# Patient Record
Sex: Female | Born: 1945
Health system: Southern US, Community
[De-identification: ages and names within clinical notes are randomized; demographics above are authoritative.]

## PROBLEM LIST (undated history)

## (undated) DIAGNOSIS — E785 Hyperlipidemia, unspecified: Secondary | ICD-10-CM

## (undated) DIAGNOSIS — C801 Malignant (primary) neoplasm, unspecified: Secondary | ICD-10-CM

## (undated) DIAGNOSIS — I10 Essential (primary) hypertension: Secondary | ICD-10-CM

## (undated) HISTORY — PX: ANTERIOR CRUCIATE LIGAMENT REPAIR: SHX115

## (undated) HISTORY — DX: Hyperlipidemia, unspecified: E78.5

## (undated) HISTORY — PX: ABDOMINAL HYSTERECTOMY: SHX81

## (undated) HISTORY — PX: MELANOMA EXCISION: SHX5266

## (undated) HISTORY — PX: OOPHORECTOMY: SHX86

## (undated) HISTORY — PX: BREAST CYST ASPIRATION: SHX578

## (undated) HISTORY — PX: SQUAMOUS CELL CARCINOMA EXCISION: SHX2433

## (undated) HISTORY — PX: BREAST BIOPSY: SHX20

## (undated) HISTORY — DX: Essential (primary) hypertension: I10

## (undated) HISTORY — PX: ECTOPIC PREGNANCY SURGERY: SHX613

## (undated) HISTORY — PX: KNEE CARTILAGE SURGERY: SHX688

---

## 2003-04-06 HISTORY — PX: EXCISION OF ATRIAL MYXOMA: SHX5821

## 2005-07-20 DIAGNOSIS — D151 Benign neoplasm of heart: Secondary | ICD-10-CM | POA: Insufficient documentation

## 2005-07-20 DIAGNOSIS — Z86018 Personal history of other benign neoplasm: Secondary | ICD-10-CM | POA: Insufficient documentation

## 2020-01-02 ENCOUNTER — Ambulatory Visit (INDEPENDENT_AMBULATORY_CARE_PROVIDER_SITE_OTHER): Payer: Medicare Other | Admitting: Internal Medicine

## 2020-01-02 ENCOUNTER — Other Ambulatory Visit: Payer: Self-pay

## 2020-01-02 ENCOUNTER — Encounter: Payer: Self-pay | Admitting: Internal Medicine

## 2020-01-02 VITALS — BP 132/74 | HR 93 | Ht 62.0 in | Wt 165.0 lb

## 2020-01-02 DIAGNOSIS — I10 Essential (primary) hypertension: Secondary | ICD-10-CM | POA: Diagnosis not present

## 2020-01-02 DIAGNOSIS — Z1231 Encounter for screening mammogram for malignant neoplasm of breast: Secondary | ICD-10-CM | POA: Diagnosis not present

## 2020-01-02 DIAGNOSIS — E782 Mixed hyperlipidemia: Secondary | ICD-10-CM | POA: Diagnosis not present

## 2020-01-02 DIAGNOSIS — Z85820 Personal history of malignant melanoma of skin: Secondary | ICD-10-CM | POA: Diagnosis not present

## 2020-01-02 NOTE — Patient Instructions (Signed)
Dermatology evaluation - [facility]

## 2020-01-02 NOTE — Progress Notes (Signed)
Date:  01/02/2020   Name:  Samantha Hodge   DOB:  06/13/1945   MRN:  [id number]   Chief Complaint: Establish Care (New patient. Her husband became a new patient here yesterday as well. New to the area- coming from [place]. )  Hypertension This is a chronic problem. The problem is controlled. Pertinent negatives include no chest pain, headaches, palpitations or shortness of breath. Past treatments include ACE inhibitors and beta blockers. There are no compliance problems.  There is no history of kidney disease, CAD/MI or CVA.  Hyperlipidemia This is a chronic problem. The problem is controlled. Pertinent negatives include no chest pain or shortness of breath. Current antihyperlipidemic treatment includes statins. The current treatment provides significant improvement of lipids.    Review of Systems  Constitutional: Negative for chills, fatigue, fever and unexpected weight change.  Respiratory: Negative for cough, chest tightness, shortness of breath and wheezing.   Cardiovascular: Negative for chest pain, palpitations and leg swelling.  Gastrointestinal: Negative for abdominal pain, constipation and diarrhea.  Musculoskeletal: Negative for arthralgias, gait problem and joint swelling.  Skin: Negative for rash.  Neurological: Negative for dizziness, light-headedness and headaches.  Psychiatric/Behavioral: Negative for dysphoric mood and sleep disturbance. The patient is not nervous/anxious.     There are no problems to display for this patient.   Allergies  Allergen Reactions  . Penicillins Hives    Past Surgical History:  Procedure Laterality Date  . ABDOMINAL HYSTERECTOMY    . ANTERIOR CRUCIATE LIGAMENT REPAIR     Right KNEE  . arterial myxomma    . ECTOPIC PREGNANCY SURGERY    . KNEE CARTILAGE SURGERY     meniscus removed - left knee  . MELANOMA EXCISION     Left Arm    Social History   Tobacco Use  . Smoking status: Former Smoker    Packs/day: 0.08    Years:  20.00    Pack years: 1.60    Types: Cigarettes    Quit date: 2007    Years since quitting: 14.7  . Smokeless tobacco: Never Used  Vaping Use  . Vaping Use: Never used  Substance Use Topics  . Alcohol use: Yes    Alcohol/week: 3.0 standard drinks    Types: 3 Glasses of wine per week  . Drug use: Never     Medication list has been reviewed and updated.  Current Meds  Medication Sig  . aspirin EC 81 MG tablet Take 81 mg by mouth daily. Swallow whole.  Marland Kitchen atorvastatin (LIPITOR) 40 MG tablet Take 40 mg by mouth daily.  . calcium-vitamin D (OSCAL WITH D) 500-200 MG-UNIT tablet Take 1 tablet by mouth.  Marland Kitchen lisinopril (ZESTRIL) 40 MG tablet Take 20 mg by mouth daily.  . metoprolol succinate (TOPROL-XL) 50 MG 24 hr tablet Take 25 mg by mouth daily. Take with or immediately following a meal.    PHQ 2/9 Scores 01/02/2020  PHQ - 2 Score 0  PHQ- 9 Score 0    GAD 7 : Generalized Anxiety Score 01/02/2020  Nervous, Anxious, on Edge 0  Control/stop worrying 0  Worry too much - different things 0  Trouble relaxing 0  Restless 0  Easily annoyed or irritable 0  Afraid - awful might happen 0  Total GAD 7 Score 0  Anxiety Difficulty Not difficult at all    BP Readings from Last 3 Encounters:  01/02/20 132/74    Physical Exam Vitals and nursing note reviewed.  Constitutional:  General: She is not in acute distress.    Appearance: She is well-developed.  HENT:     Head: Normocephalic and atraumatic.  Cardiovascular:     Rate and Rhythm: Normal rate and regular rhythm.     Pulses: Normal pulses.     Heart sounds: No murmur heard.   Pulmonary:     Effort: Pulmonary effort is normal. No respiratory distress.  Musculoskeletal:     Cervical back: Normal range of motion.     Right lower leg: No edema.     Left lower leg: No edema.  Skin:    General: Skin is warm and dry.     Capillary Refill: Capillary refill takes less than 2 seconds.     Findings: No rash.  Neurological:      General: No focal deficit present.     Mental Status: She is alert and oriented to person, place, and time.  Psychiatric:        Mood and Affect: Mood normal.        Behavior: Behavior normal.     Wt Readings from Last 3 Encounters:  01/02/20 165 lb (74.8 kg)    BP 132/74 (BP Location: Right Arm, Patient Position: Sitting, Cuff Size: Normal)   Pulse 93   Ht 5\' 2"  (1.575 m)   Wt 165 lb (74.8 kg)   SpO2 97%   BMI 30.18 kg/m   Assessment and Plan: 1. Essential hypertension Clinically stable exam with well controlled BP. Tolerating medications without side effects at this time. Pt to continue current regimen and low sodium diet; benefits of regular exercise as able discussed. - CBC with Differential/Platelet - TSH  2. Mixed hyperlipidemia On high dose statin therapy for primary prevention No side effects noted - Comprehensive metabolic panel - Lipid panel  3. Hx of melanoma of skin Needs to establish care with a dermatologist - recommend [facility]  4. Encounter for screening mammogram for breast cancer Pt will sign for previous films then schedule annual mammogram - MM 3D SCREEN BREAST BILATERAL; Future   Partially dictated using Editor, commissioning. Any errors are unintentional.  Halina Maidens, MD [facility]  01/02/2020

## 2020-01-03 LAB — CBC WITH DIFFERENTIAL/PLATELET
Basophils Absolute: 0.1 10*3/uL (ref 0.0–0.2)
Basos: 1 %
EOS (ABSOLUTE): 0.1 10*3/uL (ref 0.0–0.4)
Eos: 1 %
Hematocrit: 42 % (ref 34.0–46.6)
Hemoglobin: 14.2 g/dL (ref 11.1–15.9)
Immature Grans (Abs): 0 10*3/uL (ref 0.0–0.1)
Immature Granulocytes: 0 %
Lymphocytes Absolute: 2.2 10*3/uL (ref 0.7–3.1)
Lymphs: 33 %
MCH: 29.5 pg (ref 26.6–33.0)
MCHC: 33.8 g/dL (ref 31.5–35.7)
MCV: 87 fL (ref 79–97)
Monocytes Absolute: 0.8 10*3/uL (ref 0.1–0.9)
Monocytes: 12 %
Neutrophils Absolute: 3.5 10*3/uL (ref 1.4–7.0)
Neutrophils: 53 %
Platelets: 278 10*3/uL (ref 150–450)
RBC: 4.82 x10E6/uL (ref 3.77–5.28)
RDW: 12.2 % (ref 11.7–15.4)
WBC: 6.6 10*3/uL (ref 3.4–10.8)

## 2020-01-03 LAB — COMPREHENSIVE METABOLIC PANEL
ALT: 19 IU/L (ref 0–32)
AST: 22 IU/L (ref 0–40)
Albumin/Globulin Ratio: 2.2 (ref 1.2–2.2)
Albumin: 4.7 g/dL (ref 3.7–4.7)
Alkaline Phosphatase: 58 IU/L (ref 44–121)
BUN/Creatinine Ratio: 25 (ref 12–28)
BUN: 16 mg/dL (ref 8–27)
Bilirubin Total: 0.5 mg/dL (ref 0.0–1.2)
CO2: 24 mmol/L (ref 20–29)
Calcium: 10.2 mg/dL (ref 8.7–10.3)
Chloride: 103 mmol/L (ref 96–106)
Creatinine, Ser: 0.65 mg/dL (ref 0.57–1.00)
GFR calc Af Amer: 100 mL/min/{1.73_m2} (ref >59)
GFR calc non Af Amer: 87 mL/min/{1.73_m2} (ref >59)
Globulin, Total: 2.1 g/dL (ref 1.5–4.5)
Glucose: 80 mg/dL (ref 65–99)
Potassium: 4.6 mmol/L (ref 3.5–5.2)
Sodium: 142 mmol/L (ref 134–144)
Total Protein: 6.8 g/dL (ref 6.0–8.5)

## 2020-01-03 LAB — LIPID PANEL
Chol/HDL Ratio: 3 ratio (ref 0.0–4.4)
Cholesterol, Total: 185 mg/dL (ref 100–199)
HDL: 61 mg/dL (ref >39)
LDL Chol Calc (NIH): 100 mg/dL — ABNORMAL HIGH (ref 0–99)
Triglycerides: 139 mg/dL (ref 0–149)
VLDL Cholesterol Cal: 24 mg/dL (ref 5–40)

## 2020-01-03 LAB — TSH: TSH: 2.78 u[IU]/mL (ref 0.450–4.500)

## 2020-01-16 ENCOUNTER — Telehealth: Payer: Self-pay | Admitting: Internal Medicine

## 2020-01-16 NOTE — Telephone Encounter (Signed)
Left message for patient to call back and schedule Medicare Annual Wellness Visit (AWV) either virtually/audio only or in office. Whichever the patients preference is.  Last AWV 12/2018; please schedule at anytime with [facility] Health Advisor.  This should be a 40 minute visit.  AWV-S DUE 12/05/2019 PER [place]

## 2020-01-28 ENCOUNTER — Ambulatory Visit (INDEPENDENT_AMBULATORY_CARE_PROVIDER_SITE_OTHER): Payer: Medicare Other

## 2020-01-28 ENCOUNTER — Other Ambulatory Visit: Payer: Self-pay

## 2020-01-28 DIAGNOSIS — Z23 Encounter for immunization: Secondary | ICD-10-CM

## 2020-02-02 ENCOUNTER — Encounter: Payer: Self-pay | Admitting: Gynecology

## 2020-02-02 ENCOUNTER — Other Ambulatory Visit: Payer: Self-pay

## 2020-02-02 ENCOUNTER — Ambulatory Visit
Admission: EM | Admit: 2020-02-02 | Discharge: 2020-02-02 | Disposition: A | Discharge status: Home / Self Care | Payer: Medicare Other | Attending: Physician Assistant | Admitting: Physician Assistant

## 2020-02-02 DIAGNOSIS — R059 Cough, unspecified: Secondary | ICD-10-CM | POA: Insufficient documentation

## 2020-02-02 DIAGNOSIS — J029 Acute pharyngitis, unspecified: Secondary | ICD-10-CM | POA: Diagnosis present

## 2020-02-02 DIAGNOSIS — J069 Acute upper respiratory infection, unspecified: Secondary | ICD-10-CM | POA: Insufficient documentation

## 2020-02-02 DIAGNOSIS — R0982 Postnasal drip: Secondary | ICD-10-CM | POA: Insufficient documentation

## 2020-02-02 LAB — GROUP A STREP BY PCR: Group A Strep by PCR: NOT DETECTED

## 2020-02-02 MED ORDER — IPRATROPIUM BROMIDE 0.06 % NA SOLN: 2.0000 | Freq: Four times a day (QID) | NASAL | 12 refills | Status: DC

## 2020-02-02 MED ORDER — PSEUDOEPH-BROMPHEN-DM 30-2-10 MG/5ML PO SYRP: 10.0000 mL | ORAL_SOLUTION | Freq: Four times a day (QID) | ORAL | 0 refills | Status: AC | PRN

## 2020-02-02 NOTE — ED Triage Notes (Signed)
Patient c/o sinus headache, sinus drainage x 4-5 days.

## 2020-02-02 NOTE — Discharge Instructions (Signed)
URI/COLD SYMPTOMS: Your exam today is consistent with a viral illness. Antibiotics are not indicated at this time. Use medications as directed, including cough syrup, nasal saline, and decongestants. Your symptoms should improve over the next few days and resolve within 7-10 days. Increase rest and fluids. F/u if symptoms worsen or predominate such as sore throat, ear pain, productive cough, shortness of breath, or if you develop high fevers or worsening fatigue over the next several days.    

## 2020-02-02 NOTE — ED Provider Notes (Signed)
[facility]    CSN: [id number] Arrival date & time: 02/02/20  0913      History   Chief Complaint Chief Complaint  Patient presents with  . Sinusitis    HPI Samantha Hodge is a 74 y.o. female presenting for sore throat and postnasal drainage.  She also complains of frontal headache and nasal congestion.  Denies sinus tenderness or ear pain.  She states that her biggest concern is her sore throat.  She says that her son and granddaughter have both recently been ill.  She denies any known Covid exposure and is fully vaccinated for Covid.  She admits to personal history of Covid in December 2020 and is not concerned for Covid at this time.  She has been taking over-the-counter decongestants and says that they do help when she takes them.  Says that she felt worse yesterday than today.  She denies any fever, fatigue, body aches, chest pain, breathing difficulty, nausea, vomiting, diarrhea or changes in smell or taste.  She is otherwise healthy without any history of cardiopulmonary disease.  She has no other complaints or concerns today.  HPI  Past Medical History:  Diagnosis Date  . Hyperlipidemia   . Hypertension     Patient Active Problem List   Diagnosis Date Noted  . Essential hypertension 01/02/2020  . Mixed hyperlipidemia 01/02/2020  . Hx of melanoma of skin 01/02/2020    Past Surgical History:  Procedure Laterality Date  . ABDOMINAL HYSTERECTOMY    . ANTERIOR CRUCIATE LIGAMENT REPAIR     Right KNEE  . ECTOPIC PREGNANCY SURGERY    . EXCISION OF ATRIAL MYXOMA  2005  . KNEE CARTILAGE SURGERY     meniscus removed - left knee  . MELANOMA EXCISION     Left Arm    OB History   No obstetric history on file.      Home Medications    Prior to Admission medications   Medication Sig Start Date End Date Taking? Authorizing Provider  aspirin EC 81 MG tablet Take 81 mg by mouth daily. Swallow whole.   Yes [provider]  calcium-vitamin D (OSCAL WITH  D) 500-200 MG-UNIT tablet Take 1 tablet by mouth.   Yes [provider]  lisinopril (ZESTRIL) 40 MG tablet Take 20 mg by mouth daily.   Yes [provider]  metoprolol succinate (TOPROL-XL) 50 MG 24 hr tablet Take 25 mg by mouth daily. Take with or immediately following a meal.   Yes [provider]  rosuvastatin (CRESTOR) 40 MG tablet Take 40 mg by mouth daily.   Yes [provider]  brompheniramine-pseudoephedrine-DM 30-2-10 MG/5ML syrup Take 10 mLs by mouth 4 (four) times daily as needed for up to 7 days. 02/02/20 02/09/20  Laurene Footman B, PA-C  ipratropium (ATROVENT) 0.06 % nasal spray Place 2 sprays into both nostrils 4 (four) times daily. 02/02/20   Danton Clap, PA-C    Family History Family History  Problem Relation Age of Onset  . Breast cancer Mother   . Anuerysm Daughter   . Heart disease Maternal Grandfather     Social History Social History   Tobacco Use  . Smoking status: Former Smoker    Packs/day: 0.08    Years: 20.00    Pack years: 1.60    Types: Cigarettes    Quit date: 2007    Years since quitting: 14.8  . Smokeless tobacco: Never Used  Vaping Use  . Vaping Use: Never used  Substance  Use Topics  . Alcohol use: Yes    Alcohol/week: 3.0 standard drinks    Types: 3 Glasses of wine per week  . Drug use: Never     Allergies   Penicillins   Review of Systems Review of Systems  Constitutional: Negative for chills, diaphoresis, fatigue and fever.  HENT: Positive for congestion, postnasal drip, rhinorrhea and sore throat. Negative for ear pain, sinus pressure and sinus pain.   Respiratory: Positive for cough. Negative for shortness of breath.   Cardiovascular: Negative for chest pain.  Gastrointestinal: Negative for abdominal pain, nausea and vomiting.  Musculoskeletal: Negative for arthralgias and myalgias.  Skin: Negative for rash.  Neurological: Negative for weakness and headaches.  Hematological: Negative for  adenopathy.     Physical Exam Triage Vital Signs ED Triage Vitals  Enc Vitals Group     BP 02/02/20 0923 133/90     Pulse Rate 02/02/20 0923 98     Resp 02/02/20 0923 16     Temp 02/02/20 0923 98.4 F (36.9 C)     Temp Source 02/02/20 0923 Oral     SpO2 02/02/20 0923 98 %     Weight 02/02/20 0925 160 lb (72.6 kg)     Height 02/02/20 0925 5\' 2"  (1.575 m)     Head Circumference --      Peak Flow --      Pain Score 02/02/20 0924 8     Pain Loc --      Pain Edu? --      Excl. in [place]? --    No data found.  Updated Vital Signs BP 133/90 (BP Location: Left Arm)   Pulse 98   Temp 98.4 F (36.9 C) (Oral)   Resp 16   Ht 5\' 2"  (1.575 m)   Wt 160 lb (72.6 kg)   SpO2 98%   BMI 29.26 kg/m      Physical Exam Vitals and nursing note reviewed.  Constitutional:      General: She is not in acute distress.    Appearance: Normal appearance. She is not ill-appearing or toxic-appearing.  HENT:     Head: Normocephalic and atraumatic.     Right Ear: Tympanic membrane, ear canal and external ear normal.     Left Ear: Tympanic membrane, ear canal and external ear normal.     Nose: Congestion and rhinorrhea present.     Mouth/Throat:     Mouth: Mucous membranes are moist.     Pharynx: Oropharynx is clear. Posterior oropharyngeal erythema (with clear PND-copius amount) present.  Eyes:     General: No scleral icterus.       Right eye: No discharge.        Left eye: No discharge.     Conjunctiva/sclera: Conjunctivae normal.  Cardiovascular:     Rate and Rhythm: Normal rate and regular rhythm.     Heart sounds: Normal heart sounds.  Pulmonary:     Effort: Pulmonary effort is normal. No respiratory distress.     Breath sounds: Normal breath sounds. No wheezing, rhonchi or rales.  Musculoskeletal:     Cervical back: Neck supple.  Skin:    General: Skin is dry.  Neurological:     General: No focal deficit present.     Mental Status: She is alert. Mental status is at baseline.      Motor: No weakness.     Gait: Gait normal.  Psychiatric:        Mood and Affect: Mood normal.  Behavior: Behavior normal.        Thought Content: Thought content normal.      UC Treatments / Results  Labs (all labs ordered are listed, but only abnormal results are displayed) Labs Reviewed  GROUP A STREP BY PCR    EKG   Radiology No results found.  Procedures Procedures (including critical care time)  Medications Ordered in UC Medications - No data to display  Initial Impression / Assessment and Plan / UC Course  I have reviewed the triage vital signs and the nursing notes.  Pertinent labs & imaging results that were available during my care of the patient were reviewed by me and considered in my medical decision making (see chart for details).   Patient symptoms consistent with viral upper respiratory infection.  She declines any Covid testing at this time.  Rapid strep test performed today and negative.  Advised patient that she has a upper respiratory infection and it should resolve within 7 to 10 days.  I sent Bromfed and Atrovent nasal spray to pharmacy.  Advised increasing rest and fluid intake.  Advised to follow-up as needed if not better over the next week or she develops any new or worsening symptoms including fever, fatigue, chest pain, worsening cough or breathing difficulty.  Patient agreeable.  Final Clinical Impressions(s) / UC Diagnoses   Final diagnoses:  Acute upper respiratory infection  Acute sore throat  Post-nasal drainage  Cough     Discharge Instructions     URI/COLD SYMPTOMS: Your exam today is consistent with a viral illness. Antibiotics are not indicated at this time. Use medications as directed, including cough syrup, nasal saline, and decongestants. Your symptoms should improve over the next few days and resolve within 7-10 days. Increase rest and fluids. F/u if symptoms worsen or predominate such as sore throat, ear pain, productive  cough, shortness of breath, or if you develop high fevers or worsening fatigue over the next several days.      ED Prescriptions    Medication Sig Dispense Auth. Provider   ipratropium (ATROVENT) 0.06 % nasal spray Place 2 sprays into both nostrils 4 (four) times daily. 15 mL Laurene Footman B, PA-C   brompheniramine-pseudoephedrine-DM 30-2-10 MG/5ML syrup Take 10 mLs by mouth 4 (four) times daily as needed for up to 7 days. 120 mL Danton Clap, PA-C     PDMP not reviewed this encounter.   Danton Clap, PA-C 02/02/20 1054

## 2020-02-12 ENCOUNTER — Ambulatory Visit
Admission: RE | Admit: 2020-02-12 | Discharge: 2020-02-12 | Disposition: A | Discharge status: Home / Self Care | Payer: Medicare Other | Source: Ambulatory Visit | Attending: Internal Medicine | Admitting: Internal Medicine

## 2020-02-12 ENCOUNTER — Other Ambulatory Visit: Payer: Self-pay

## 2020-02-12 DIAGNOSIS — Z1231 Encounter for screening mammogram for malignant neoplasm of breast: Secondary | ICD-10-CM | POA: Diagnosis present

## 2020-02-12 HISTORY — DX: Malignant (primary) neoplasm, unspecified: C80.1

## 2020-03-24 ENCOUNTER — Other Ambulatory Visit: Payer: Self-pay | Admitting: Internal Medicine

## 2020-03-24 NOTE — Telephone Encounter (Signed)
Requested medication (s) are due for refill today - unsure  Requested medication (s) are on the active medication list -yes  Future visit scheduled -yes  Last refill: 01/17/20  Notes to clinic: Request RF- historical medications/provider  Requested Prescriptions  Pending Prescriptions Disp Refills   metoprolol succinate (TOPROL-XL) 50 MG 24 hr tablet [Pharmacy Med Name: METOPROLOL SUCC ER 50 MG TAB] 90 tablet 1    Sig: TAKE 1 TABLET BY MOUTH EVERY DAY      Cardiovascular:  Beta Blockers Failed - 03/24/2020  9:44 AM      Failed - Last BP in normal range    BP Readings from Last 1 Encounters:  02/02/20 133/90          Passed - Last Heart Rate in normal range    Pulse Readings from Last 1 Encounters:  02/02/20 98          Passed - Valid encounter within last 6 months    Recent Outpatient Visits           2 months ago Essential hypertension   [facility] Glean Hess, MD       Future Appointments             In 3 months [facility] Melia Jesse Sans, MD [facility]               lisinopril (ZESTRIL) 40 MG tablet [Pharmacy Med Name: LISINOPRIL 40 MG TABLET] 45 tablet 1    Sig: TAKE 1/2 TABLET BY MOUTH EVERY MORNING      Cardiovascular:  ACE Inhibitors Failed - 03/24/2020  9:44 AM      Failed - Last BP in normal range    BP Readings from Last 1 Encounters:  02/02/20 133/90          Passed - Cr in normal range and within 180 days    Creatinine, Ser  Date Value Ref Range Status  01/02/2020 0.65 0.57 - 1.00 mg/dL Final          Passed - K in normal range and within 180 days    Potassium  Date Value Ref Range Status  01/02/2020 4.6 3.5 - 5.2 mmol/L Final          Passed - Patient is not pregnant      Passed - Valid encounter within last 6 months    Recent Outpatient Visits           2 months ago Essential hypertension   [place], Laura H, MD       Future Appointments             In 3 months Glean Hess, MD [facility], [facility]                 Requested Prescriptions  Pending Prescriptions Disp Refills   metoprolol succinate (TOPROL-XL) 50 MG 24 hr tablet [Pharmacy Med Name: METOPROLOL SUCC ER 50 MG TAB] 90 tablet 1    Sig: TAKE 1 TABLET BY MOUTH EVERY DAY      Cardiovascular:  Beta Blockers Failed - 03/24/2020  9:44 AM      Failed - Last BP in normal range    BP Readings from Last 1 Encounters:  02/02/20 133/90          Passed - Last Heart Rate in normal range    Pulse Readings from Last 1 Encounters:  02/02/20 98  Passed - Valid encounter within last 6 months    Recent Outpatient Visits           2 months ago Essential hypertension   [facility] Glean Hess, MD       Future Appointments             In 3 months [facility] Melia Jesse Sans, MD [facility]               lisinopril (ZESTRIL) 40 MG tablet [Pharmacy Med Name: LISINOPRIL 40 MG TABLET] 45 tablet 1    Sig: TAKE 1/2 TABLET BY MOUTH EVERY MORNING      Cardiovascular:  ACE Inhibitors Failed - 03/24/2020  9:44 AM      Failed - Last BP in normal range    BP Readings from Last 1 Encounters:  02/02/20 133/90          Passed - Cr in normal range and within 180 days    Creatinine, Ser  Date Value Ref Range Status  01/02/2020 0.65 0.57 - 1.00 mg/dL Final          Passed - K in normal range and within 180 days    Potassium  Date Value Ref Range Status  01/02/2020 4.6 3.5 - 5.2 mmol/L Final          Passed - Patient is not pregnant      Passed - Valid encounter within last 6 months    Recent Outpatient Visits           2 months ago Essential hypertension   [facility] Glean Hess, MD       Future Appointments             In 3 months [facility] Melia Jesse Sans, MD [facility], [facility]

## 2020-05-13 DIAGNOSIS — L821 Other seborrheic keratosis: Secondary | ICD-10-CM | POA: Diagnosis not present

## 2020-05-13 DIAGNOSIS — L578 Other skin changes due to chronic exposure to nonionizing radiation: Secondary | ICD-10-CM | POA: Diagnosis not present

## 2020-05-13 DIAGNOSIS — Z859 Personal history of malignant neoplasm, unspecified: Secondary | ICD-10-CM | POA: Diagnosis not present

## 2020-05-13 DIAGNOSIS — Z85820 Personal history of malignant melanoma of skin: Secondary | ICD-10-CM | POA: Diagnosis not present

## 2020-05-13 DIAGNOSIS — L570 Actinic keratosis: Secondary | ICD-10-CM | POA: Diagnosis not present

## 2020-05-13 DIAGNOSIS — D485 Neoplasm of uncertain behavior of skin: Secondary | ICD-10-CM | POA: Diagnosis not present

## 2020-05-16 ENCOUNTER — Ambulatory Visit
Admission: EM | Admit: 2020-05-16 | Discharge: 2020-05-16 | Disposition: A | Discharge status: Other Acute Inpatient Hospital | Payer: Medicare Other | Attending: Family Medicine | Admitting: Family Medicine

## 2020-05-16 ENCOUNTER — Ambulatory Visit: Payer: Self-pay | Admitting: *Deleted

## 2020-05-16 ENCOUNTER — Other Ambulatory Visit: Payer: Self-pay

## 2020-05-16 DIAGNOSIS — E7800 Pure hypercholesterolemia, unspecified: Secondary | ICD-10-CM | POA: Diagnosis not present

## 2020-05-16 DIAGNOSIS — E785 Hyperlipidemia, unspecified: Secondary | ICD-10-CM | POA: Diagnosis not present

## 2020-05-16 DIAGNOSIS — D329 Benign neoplasm of meninges, unspecified: Secondary | ICD-10-CM | POA: Diagnosis not present

## 2020-05-16 DIAGNOSIS — R42 Dizziness and giddiness: Secondary | ICD-10-CM | POA: Diagnosis not present

## 2020-05-16 DIAGNOSIS — I10 Essential (primary) hypertension: Secondary | ICD-10-CM | POA: Diagnosis not present

## 2020-05-16 DIAGNOSIS — H9319 Tinnitus, unspecified ear: Secondary | ICD-10-CM | POA: Diagnosis not present

## 2020-05-16 DIAGNOSIS — Z87891 Personal history of nicotine dependence: Secondary | ICD-10-CM | POA: Diagnosis not present

## 2020-05-16 DIAGNOSIS — Z880 Allergy status to penicillin: Secondary | ICD-10-CM | POA: Diagnosis not present

## 2020-05-16 DIAGNOSIS — I6782 Cerebral ischemia: Secondary | ICD-10-CM | POA: Diagnosis not present

## 2020-05-16 LAB — BASIC METABOLIC PANEL
BUN: 12 (ref 4–21)
CO2: 25 — AB (ref 13–22)
Chloride: 107 (ref 99–108)
Creatinine: 0.6 (ref 0.5–1.1)
Glucose: 102
Potassium: 4.5 (ref 3.4–5.3)
Sodium: 139 (ref 137–147)

## 2020-05-16 LAB — HEPATIC FUNCTION PANEL
ALT: 40 — AB (ref 7–35)
AST: 37 — AB (ref 13–35)
Alkaline Phosphatase: 59 (ref 25–125)
Bilirubin, Total: 0.7

## 2020-05-16 NOTE — Telephone Encounter (Signed)
  Patient's husband called and reported patient was dizzy and almost passed out after leaning forward this am. Patient reports she woke up around 6am and was dizzy but able to walk to the bathroom and able to take a shower. Reports off balance, or unsteady  walking, now sitting in chair, and B/P 182/117 HR 76. Denies room spinning , nausea or vomiting. Denies weakness on either side, visual disturbances or facial numbness. No slurred or garbled speech. Husband able to recheck B/P for 173/104 HR 71 while sitting. Patient reports she did take her B/P meds this am. Encouraged patient and husband to get patient assessed at ED. Care advise given. . Patient and husband verbalized understanding of care advise and to go to ED and if symptoms worsen call 911.    Reason for Disposition . [1] Dizziness (vertigo) present now AND [2] one or more STROKE RISK FACTORS (i.e., hypertension, diabetes, prior stroke/TIA, heart attack)  (Exception: prior physician evaluation for this AND no different/worse than usual)  Answer Assessment - Initial Assessment Questions 1. DESCRIPTION: "Describe your dizziness."     When leaned over became dizzy  2. VERTIGO: "Do you feel like either you or the room is spinning or tilting?"      No  3. LIGHTHEADED: "Do you feel lightheaded?" (e.g., somewhat faint, woozy, weak upon standing)     Feels different but cant explain how she is feeling  4. SEVERITY: "How bad is it?"  "Can you walk?"   - MILD: Feels unsteady but walking normally.   - MODERATE: Feels very unsteady when walking, but not falling; interferes with normal activities (e.g., school, work) .   - SEVERE: Unable to walk without falling, or requires assistance to walk without falling.     Moderate  5. ONSET:  "When did the dizziness begin?"     Around 6am in bed ceiling started spinning around 6. AGGRAVATING FACTORS: "Does anything make it worse?" (e.g., standing, change in head position)     Change in head position 7.  CAUSE: "What do you think is causing the dizziness?"     Not sure  8. RECURRENT SYMPTOM: "Have you had dizziness before?" If Yes, ask: "When was the last time?" "What happened that time?"     Yes a long time ago  9. OTHER SYMPTOMS: "Do you have any other symptoms?" (e.g., headache, weakness, numbness, vomiting, earache)     diarrhea 10. PREGNANCY: "Is there any chance you are pregnant?" "When was your last menstrual period?"       na  Protocols used: DIZZINESS - VERTIGO-A-AH

## 2020-05-20 ENCOUNTER — Encounter: Payer: Self-pay | Admitting: Internal Medicine

## 2020-05-20 ENCOUNTER — Ambulatory Visit (INDEPENDENT_AMBULATORY_CARE_PROVIDER_SITE_OTHER): Payer: Medicare HMO | Admitting: Internal Medicine

## 2020-05-20 ENCOUNTER — Other Ambulatory Visit: Payer: Self-pay

## 2020-05-20 VITALS — BP 152/88 | HR 99 | Temp 97.6°F | Ht 62.0 in | Wt 164.0 lb

## 2020-05-20 DIAGNOSIS — D329 Benign neoplasm of meninges, unspecified: Secondary | ICD-10-CM | POA: Insufficient documentation

## 2020-05-20 DIAGNOSIS — N3001 Acute cystitis with hematuria: Secondary | ICD-10-CM

## 2020-05-20 DIAGNOSIS — R42 Dizziness and giddiness: Secondary | ICD-10-CM

## 2020-05-20 LAB — POC URINALYSIS WITH MICROSCOPIC (NON AUTO)MANUAL RESULT
Bilirubin, UA: NEGATIVE
Crystals: 0
Epithelial cells, urine per micros: 0
Glucose, UA: NEGATIVE
Ketones, UA: NEGATIVE
Mucus, UA: 0
Nitrite, UA: NEGATIVE
Protein, UA: POSITIVE — AB
RBC: 100 M/uL — AB (ref 4.04–5.48)
Spec Grav, UA: >=1.03 — AB (ref 1.010–1.025)
Urobilinogen, UA: 0.2 E.U./dL
WBC Casts, UA: 100
pH, UA: 5 (ref 5.0–8.0)

## 2020-05-20 MED ORDER — SULFAMETHOXAZOLE-TRIMETHOPRIM 800-160 MG PO TABS: 1.0000 | ORAL_TABLET | Freq: Two times a day (BID) | ORAL | 0 refills | Status: AC

## 2020-05-20 NOTE — Progress Notes (Signed)
Date:  05/20/2020   Name:  Samantha Hodge   DOB:  1945/07/22   MRN:  [id number]   Chief Complaint: Urinary Tract Infection (X3 days, Strong odor, burning, blood, urgency to go but little comes out ) and Colon Cancer Screening (Wants Colonoscopy )  Urinary Tract Infection  This is a new problem. The current episode started in the past 7 days. The problem occurs every urination. The problem has been unchanged. The quality of the pain is described as burning. The pain is mild. There has been no fever. Associated symptoms include frequency, hematuria, hesitancy and urgency. Pertinent negatives include no chills or nausea. She has tried increased fluids for the symptoms. The treatment provided no relief.  Dizziness This is a new problem. The current episode started in the past 7 days. The problem has been rapidly improving. Pertinent negatives include no chest pain, chills, fatigue, fever, headaches, nausea or weakness. The symptoms are aggravated by bending. Treatments tried: Epley maneuvers. The treatment provided moderate relief.  Seen at [facility] - MRI done with following findings.  She is waiting for a call from [facility]. MRI 05/2020: IMPRESSION:  Mild small vessel ischemic changes.   Multiple remote supratentorial and infratentorial and infarcts. Cerebellar chronic infarcts are numerous in comparison to supratentorial.   Small right parafalcine meningioma measuring 0.9 cm.   Small enhancing lesion in thedens and may be degenerative however cannot exclude metastatic lesion. Please correlate with patient history.  Lab Results  Component Value Date   CREATININE 0.65 01/02/2020   BUN 16 01/02/2020   NA 142 01/02/2020   K 4.6 01/02/2020   CL 103 01/02/2020   CO2 24 01/02/2020   Lab Results  Component Value Date   CHOL 185 01/02/2020   HDL 61 01/02/2020   LDLCALC 100 (H) 01/02/2020   TRIG 139 01/02/2020   CHOLHDL 3.0 01/02/2020   Lab Results  Component Value Date   TSH 2.780  01/02/2020   No results found for: HGBA1C Lab Results  Component Value Date   WBC 6.6 01/02/2020   HGB 14.2 01/02/2020   HCT 42.0 01/02/2020   MCV 87 01/02/2020   PLT 278 01/02/2020   Lab Results  Component Value Date   ALT 19 01/02/2020   AST 22 01/02/2020   ALKPHOS 58 01/02/2020   BILITOT 0.5 01/02/2020     Review of Systems  Constitutional: Negative for chills, fatigue and fever.  Eyes: Negative for visual disturbance.  Respiratory: Negative for chest tightness and shortness of breath.   Cardiovascular: Negative for chest pain, palpitations and leg swelling.  Gastrointestinal: Negative for diarrhea and nausea.  Genitourinary: Positive for frequency, hematuria, hesitancy and urgency.  Neurological: Positive for dizziness (much improved). Negative for weakness and headaches.    Patient Active Problem List   Diagnosis Date Noted  . Meningioma ([place]) 05/20/2020  . Essential hypertension 01/02/2020  . Mixed hyperlipidemia 01/02/2020  . Hx of melanoma of skin 01/02/2020    Allergies  Allergen Reactions  . Penicillins Hives    Past Surgical History:  Procedure Laterality Date  . ABDOMINAL HYSTERECTOMY    . ANTERIOR CRUCIATE LIGAMENT REPAIR     Right KNEE  . BREAST BIOPSY Left    stereo bx/clip-neg  . BREAST CYST ASPIRATION Left    fna- neg  . ECTOPIC PREGNANCY SURGERY    . EXCISION OF ATRIAL MYXOMA  2005  . KNEE CARTILAGE SURGERY     meniscus removed - left knee  . MELANOMA EXCISION  Left Arm  . OOPHORECTOMY      Social History   Tobacco Use  . Smoking status: Former Smoker    Packs/day: 0.08    Years: 20.00    Pack years: 1.60    Types: Cigarettes    Quit date: 2007    Years since quitting: 15.1  . Smokeless tobacco: Never Used  Vaping Use  . Vaping Use: Never used  Substance Use Topics  . Alcohol use: Yes    Alcohol/week: 3.0 standard drinks    Types: 3 Glasses of wine per week  . Drug use: Never     Medication list has been reviewed  and updated.  Current Meds  Medication Sig  . aspirin EC 81 MG tablet Take 81 mg by mouth daily. Swallow whole.  . calcium-vitamin D (OSCAL WITH D) 500-200 MG-UNIT tablet Take 1 tablet by mouth.  Marland Kitchen lisinopril (ZESTRIL) 40 MG tablet TAKE 1/2 TABLET BY MOUTH EVERY MORNING  . metoprolol succinate (TOPROL-XL) 50 MG 24 hr tablet TAKE 1 TABLET BY MOUTH EVERY DAY (Patient taking differently: Half a tablet (25 MG))  . rosuvastatin (CRESTOR) 40 MG tablet Take 40 mg by mouth daily.  Marland Kitchen sulfamethoxazole-trimethoprim (BACTRIM DS) 800-160 MG tablet Take 1 tablet by mouth 2 (two) times daily for 7 days.    PHQ 2/9 Scores 05/20/2020 01/02/2020  PHQ - 2 Score 0 0  PHQ- 9 Score 0 0    GAD 7 : Generalized Anxiety Score 05/20/2020 01/02/2020  Nervous, Anxious, on Edge 1 0  Control/stop worrying 0 0  Worry too much - different things 0 0  Trouble relaxing 0 0  Restless 0 0  Easily annoyed or irritable 0 0  Afraid - awful might happen 0 0  Total GAD 7 Score 1 0  Anxiety Difficulty - Not difficult at all    BP Readings from Last 3 Encounters:  05/20/20 (!) 152/88  02/02/20 133/90  01/02/20 132/74    Physical Exam Vitals and nursing note reviewed.  Constitutional:      Appearance: Normal appearance. She is well-developed and well-nourished.  HENT:     Head: Normocephalic.  Eyes:     Extraocular Movements: Extraocular movements intact.     Pupils: Pupils are equal, round, and reactive to light.  Cardiovascular:     Rate and Rhythm: Normal rate and regular rhythm.     Pulses: Normal pulses.     Heart sounds: Normal heart sounds.  Pulmonary:     Effort: Pulmonary effort is normal. No respiratory distress.     Breath sounds: Normal breath sounds.  Abdominal:     General: Bowel sounds are normal.     Palpations: Abdomen is soft.     Tenderness: There is no abdominal tenderness. There is no CVA tenderness, guarding or rebound.  Lymphadenopathy:     Cervical: No cervical adenopathy.  Skin:     General: Skin is warm and dry.     Findings: Lesion (from skin lesion destruction by Derm) present.  Neurological:     Mental Status: She is alert.  Psychiatric:        Mood and Affect: Mood and affect and mood normal.        Behavior: Behavior normal.     Wt Readings from Last 3 Encounters:  05/20/20 164 lb (74.4 kg)  02/02/20 160 lb (72.6 kg)  01/02/20 165 lb (74.8 kg)    BP (!) 152/88   Pulse 99   Temp 97.6 F (36.4 C) (Oral)  Ht 5\' 2"  (1.575 m)   Wt 164 lb (74.4 kg)   SpO2 96%   BMI 30.00 kg/m   Assessment and Plan: 1. Acute cystitis with hematuria Continue to push fluids Can take AZO if needed - POC urinalysis w microscopic (non auto) - sulfamethoxazole-trimethoprim (BACTRIM DS) 800-160 MG tablet; Take 1 tablet by mouth 2 (two) times daily for 7 days.  Dispense: 14 tablet; Refill: 0  2. Meningioma ([place]) Benign and not likely to be causing vertigo However, the other brain lesion in the dens needs to be reviewed by Neurology - [facility] will be calling her to schedule an appointment  3. Vertigo Continue Epley exercises Much improved today - no medication is needed.   Partially dictated using Editor, commissioning. Any errors are unintentional.  Halina Maidens, MD [facility]  05/20/2020

## 2020-05-22 DIAGNOSIS — D329 Benign neoplasm of meninges, unspecified: Secondary | ICD-10-CM | POA: Diagnosis not present

## 2020-06-10 DIAGNOSIS — L570 Actinic keratosis: Secondary | ICD-10-CM | POA: Diagnosis not present

## 2020-06-30 NOTE — Progress Notes (Signed)
Date:  07/01/2020   Name:  Samantha Hodge   DOB:  1945/09/05   MRN:  [id number]   Chief Complaint: Annual Exam (Breast exam no pap) and Hypertension  Samantha Hodge is a 75 y.o. female who presents today for her Complete Annual Exam. She feels well. She reports exercising none. She reports she is sleeping well. Breast complaints none.  Mammogram: 02/2020 DEXA: remote - normal Pap smear: discontinued Colonoscopy: ~2012  Immunization History  Administered Date(s) Administered  . Fluad Quad(high Dose 65+) 01/28/2020  . PFIZER(Purple Top)SARS-COV-2 Vaccination 05/03/2019, 05/24/2019, 03/24/2020  . Pneumococcal Conjugate-13 04/05/2017  . Pneumococcal Polysaccharide-23 04/05/2018  . Tdap 03/20/2019  . Tetanus 03/14/2008    Hypertension This is a chronic problem. The problem is controlled (cuts pills in half). Pertinent negatives include no chest pain, headaches, palpitations or shortness of breath. (Not checking BP at home) Past treatments include beta blockers and ACE inhibitors. The current treatment provides significant improvement. There are no compliance problems.  Hypertensive end-organ damage includes CVA (hx of TIA). There is no history of kidney disease or CAD/MI.  Hyperlipidemia This is a chronic problem. The problem is controlled. There are no known factors aggravating her hyperlipidemia. Pertinent negatives include no chest pain or shortness of breath. Current antihyperlipidemic treatment includes statins. The current treatment provides significant improvement of lipids. There are no compliance problems.     Lab Results  Component Value Date   CREATININE 0.6 05/16/2020   BUN 12 05/16/2020   NA 139 05/16/2020   K 4.5 05/16/2020   CL 107 05/16/2020   CO2 25 (A) 05/16/2020   Lab Results  Component Value Date   CHOL 185 01/02/2020   HDL 61 01/02/2020   LDLCALC 100 (H) 01/02/2020   TRIG 139 01/02/2020   CHOLHDL 3.0 01/02/2020   Lab Results  Component Value Date   TSH  2.780 01/02/2020   No results found for: HGBA1C Lab Results  Component Value Date   WBC 6.6 01/02/2020   HGB 14.2 01/02/2020   HCT 42.0 01/02/2020   MCV 87 01/02/2020   PLT 278 01/02/2020   Lab Results  Component Value Date   ALT 40 (A) 05/16/2020   AST 37 (A) 05/16/2020   ALKPHOS 59 05/16/2020   BILITOT 0.5 01/02/2020     Review of Systems  Constitutional: Negative for chills, fatigue and fever.  HENT: Negative for congestion, hearing loss, tinnitus, trouble swallowing and voice change.   Eyes: Negative for visual disturbance (intermittent - wavy lines in right eye then gradually fades away over several minutes).  Respiratory: Negative for cough, chest tightness, shortness of breath and wheezing.   Cardiovascular: Negative for chest pain, palpitations and leg swelling.  Gastrointestinal: Negative for abdominal pain, constipation, diarrhea and vomiting.  Endocrine: Negative for polydipsia and polyuria.  Genitourinary: Negative for dysuria, frequency, genital sores, vaginal bleeding and vaginal discharge.  Musculoskeletal: Negative for arthralgias, gait problem and joint swelling.  Skin: Negative for color change and rash.  Neurological: Negative for dizziness, tremors, seizures, syncope, speech difficulty, light-headedness and headaches.  Hematological: Negative for adenopathy. Does not bruise/bleed easily.  Psychiatric/Behavioral: Negative for dysphoric mood and sleep disturbance. The patient is not nervous/anxious.     Patient Active Problem List   Diagnosis Date Noted  . Meningioma ([place]) 05/20/2020  . Essential hypertension 01/02/2020  . Mixed hyperlipidemia 01/02/2020  . Hx of melanoma of skin 01/02/2020  . Myxoma of heart 07/20/2005    Allergies  Allergen Reactions  . Penicillins  Hives  . Ezetimibe     Past Surgical History:  Procedure Laterality Date  . ABDOMINAL HYSTERECTOMY    . ANTERIOR CRUCIATE LIGAMENT REPAIR     Right KNEE  . BREAST BIOPSY Left     stereo bx/clip-neg  . BREAST CYST ASPIRATION Left    fna- neg  . ECTOPIC PREGNANCY SURGERY    . EXCISION OF ATRIAL MYXOMA  2005  . KNEE CARTILAGE SURGERY     meniscus removed - left knee  . MELANOMA EXCISION     Left Arm  . OOPHORECTOMY      Social History   Tobacco Use  . Smoking status: Former Smoker    Packs/day: 0.08    Years: 20.00    Pack years: 1.60    Types: Cigarettes    Quit date: 2007    Years since quitting: 15.2  . Smokeless tobacco: Never Used  Vaping Use  . Vaping Use: Never used  Substance Use Topics  . Alcohol use: Yes    Alcohol/week: 3.0 standard drinks    Types: 3 Glasses of wine per week  . Drug use: Never     Medication list has been reviewed and updated.  Current Meds  Medication Sig  . aspirin EC 81 MG tablet Take 81 mg by mouth daily. Swallow whole.  . calcium-vitamin D (OSCAL WITH D) 500-200 MG-UNIT tablet Take 1 tablet by mouth.  . [DISCONTINUED] lisinopril (ZESTRIL) 40 MG tablet TAKE 1/2 TABLET BY MOUTH EVERY MORNING  . [DISCONTINUED] metoprolol succinate (TOPROL-XL) 50 MG 24 hr tablet TAKE 1 TABLET BY MOUTH EVERY DAY (Patient taking differently: Half a tablet (25 MG))  . [DISCONTINUED] rosuvastatin (CRESTOR) 40 MG tablet Take 40 mg by mouth daily.    PHQ 2/9 Scores 07/01/2020 05/20/2020 01/02/2020  PHQ - 2 Score 0 0 0  PHQ- 9 Score 0 0 0    GAD 7 : Generalized Anxiety Score 07/01/2020 05/20/2020 01/02/2020  Nervous, Anxious, on Edge 0 1 0  Control/stop worrying 0 0 0  Worry too much - different things 0 0 0  Trouble relaxing 0 0 0  Restless 0 0 0  Easily annoyed or irritable 0 0 0  Afraid - awful might happen 0 0 0  Total GAD 7 Score 0 1 0  Anxiety Difficulty - - Not difficult at all    BP Readings from Last 3 Encounters:  07/01/20 (!) 148/86  05/20/20 (!) 152/88  02/02/20 133/90    Physical Exam Vitals and nursing note reviewed.  Constitutional:      General: She is not in acute distress.    Appearance: She is  well-developed.  HENT:     Head: Normocephalic and atraumatic.     Right Ear: Tympanic membrane and ear canal normal.     Left Ear: Tympanic membrane and ear canal normal.     Nose:     Right Sinus: No maxillary sinus tenderness.     Left Sinus: No maxillary sinus tenderness.  Eyes:     General: No scleral icterus.       Right eye: No discharge.        Left eye: No discharge.     Conjunctiva/sclera: Conjunctivae normal.  Neck:     Thyroid: No thyromegaly.     Vascular: No carotid bruit.  Cardiovascular:     Rate and Rhythm: Normal rate and regular rhythm.     Pulses: Normal pulses.     Heart sounds: Normal heart sounds.  Pulmonary:  Effort: Pulmonary effort is normal. No respiratory distress.     Breath sounds: No wheezing.  Chest:  Breasts:     Right: No mass, nipple discharge, skin change or tenderness.     Left: No mass, nipple discharge, skin change or tenderness.    Abdominal:     General: Bowel sounds are normal.     Palpations: Abdomen is soft.     Tenderness: There is no abdominal tenderness.  Musculoskeletal:        General: No tenderness.     Cervical back: Normal range of motion. No erythema.     Right hip: Normal.     Left hip: Normal.     Right knee: Normal.     Left knee: Normal.     Right lower leg: No edema.     Left lower leg: No edema.  Lymphadenopathy:     Cervical: No cervical adenopathy.  Skin:    General: Skin is warm and dry.     Capillary Refill: Capillary refill takes less than 2 seconds.     Findings: No rash.  Neurological:     General: No focal deficit present.     Mental Status: She is alert and oriented to person, place, and time.     Cranial Nerves: No cranial nerve deficit.     Sensory: No sensory deficit.     Deep Tendon Reflexes: Reflexes are normal and symmetric.  Psychiatric:        Attention and Perception: Attention normal.        Mood and Affect: Mood normal.        Behavior: Behavior normal.     Wt Readings from  Last 3 Encounters:  07/01/20 164 lb (74.4 kg)  05/20/20 164 lb (74.4 kg)  02/02/20 160 lb (72.6 kg)    BP (!) 148/86   Pulse 65   Temp 98 F (36.7 C) (Oral)   Ht 5\' 2"  (1.575 m)   Wt 164 lb (74.4 kg)   SpO2 97%   BMI 30.00 kg/m   Assessment and Plan: 1. Annual physical exam Normal exam Continue healthy diet, exercise Continue Calcium and vitamin D Pt declines DEXA  2. Essential hypertension Clinically stable exam with elevated BP today. Pt will monitor at home and increase lisinopril if needed. Follow up in 6 mo or earlier if needed. Tolerating medications without side effects at this time. Pt to continue current regimen and low sodium diet; benefits of regular exercise as able discussed. - TSH - CBC with Differential/Platelet - metoprolol succinate (TOPROL-XL) 50 MG 24 hr tablet; Half a tablet (25 MG)  Dispense: 90 tablet; Refill: 1 - lisinopril (ZESTRIL) 40 MG tablet; Take 0.5 tablets (20 mg total) by mouth every morning.  Dispense: 45 tablet; Refill: 1 - POCT urinalysis dipstick  3. Mixed hyperlipidemia Tolerating statin medication without side effects at this time LDL is at goal of < 70 on current dose Continue same therapy without change at this time. - Lipid panel - rosuvastatin (CRESTOR) 40 MG tablet; Take 1 tablet (40 mg total) by mouth daily.  Dispense: 90 tablet; Refill: 1  4. Meningioma [facility]) Being followed by Neurosurgery Repeat scan next month  5. Need for hepatitis C screening test - Hepatitis C antibody  6. Colon cancer screening Due for 10 yr screening - Ambulatory referral to Gastroenterology  7. Ocular migraine Likely the cause of her intermittent sx Continue annual retinal exams Follow up if sx change/worsen   Partially dictated using Dragon  software. Any errors are unintentional.  Halina Maidens, MD [facility]  07/01/2020

## 2020-07-01 ENCOUNTER — Encounter: Payer: Self-pay | Admitting: Internal Medicine

## 2020-07-01 ENCOUNTER — Other Ambulatory Visit: Payer: Self-pay

## 2020-07-01 ENCOUNTER — Ambulatory Visit (INDEPENDENT_AMBULATORY_CARE_PROVIDER_SITE_OTHER): Payer: Medicare HMO | Admitting: Internal Medicine

## 2020-07-01 VITALS — BP 148/86 | HR 65 | Temp 98.0°F | Ht 62.0 in | Wt 164.0 lb

## 2020-07-01 DIAGNOSIS — Z0000 Encounter for general adult medical examination without abnormal findings: Secondary | ICD-10-CM | POA: Diagnosis not present

## 2020-07-01 DIAGNOSIS — Z1159 Encounter for screening for other viral diseases: Secondary | ICD-10-CM

## 2020-07-01 DIAGNOSIS — G43109 Migraine with aura, not intractable, without status migrainosus: Secondary | ICD-10-CM

## 2020-07-01 DIAGNOSIS — Z1211 Encounter for screening for malignant neoplasm of colon: Secondary | ICD-10-CM | POA: Diagnosis not present

## 2020-07-01 DIAGNOSIS — D329 Benign neoplasm of meninges, unspecified: Secondary | ICD-10-CM

## 2020-07-01 DIAGNOSIS — E782 Mixed hyperlipidemia: Secondary | ICD-10-CM | POA: Diagnosis not present

## 2020-07-01 DIAGNOSIS — I10 Essential (primary) hypertension: Secondary | ICD-10-CM

## 2020-07-01 LAB — POCT URINALYSIS DIPSTICK
Bilirubin, UA: NEGATIVE
Blood, UA: NEGATIVE
Glucose, UA: NEGATIVE
Ketones, UA: NEGATIVE
Leukocytes, UA: NEGATIVE
Nitrite, UA: NEGATIVE
Protein, UA: NEGATIVE
Spec Grav, UA: <=1.005 — AB (ref 1.010–1.025)
Urobilinogen, UA: 0.2 E.U./dL
pH, UA: 6.5 (ref 5.0–8.0)

## 2020-07-01 MED ORDER — LISINOPRIL 40 MG PO TABS: 20.0000 mg | ORAL_TABLET | Freq: Every morning | ORAL | 1 refills | Status: DC

## 2020-07-01 MED ORDER — ROSUVASTATIN CALCIUM 40 MG PO TABS: 40.0000 mg | ORAL_TABLET | Freq: Every day | ORAL | 1 refills | Status: DC

## 2020-07-01 MED ORDER — METOPROLOL SUCCINATE ER 50 MG PO TB24: ORAL_TABLET | ORAL | 1 refills | Status: DC

## 2020-07-01 NOTE — Patient Instructions (Signed)
Check BP at home several times per week for 4 weeks If mostly greater than 140, increase to a whole lisinopril. If BP is controlled, follow up in 6 months.  If not, make a follow up appointment.

## 2020-07-02 LAB — CBC WITH DIFFERENTIAL/PLATELET
Basophils Absolute: 0.1 10*3/uL (ref 0.0–0.2)
Basos: 1 %
EOS (ABSOLUTE): 0.1 10*3/uL (ref 0.0–0.4)
Eos: 1 %
Hematocrit: 43.8 % (ref 34.0–46.6)
Hemoglobin: 14.2 g/dL (ref 11.1–15.9)
Immature Grans (Abs): 0 10*3/uL (ref 0.0–0.1)
Immature Granulocytes: 0 %
Lymphocytes Absolute: 2.2 10*3/uL (ref 0.7–3.1)
Lymphs: 25 %
MCH: 27.9 pg (ref 26.6–33.0)
MCHC: 32.4 g/dL (ref 31.5–35.7)
MCV: 86 fL (ref 79–97)
Monocytes Absolute: 0.7 10*3/uL (ref 0.1–0.9)
Monocytes: 8 %
Neutrophils Absolute: 5.6 10*3/uL (ref 1.4–7.0)
Neutrophils: 65 %
Platelets: 326 10*3/uL (ref 150–450)
RBC: 5.09 x10E6/uL (ref 3.77–5.28)
RDW: 12.6 % (ref 11.7–15.4)
WBC: 8.7 10*3/uL (ref 3.4–10.8)

## 2020-07-02 LAB — LIPID PANEL
Chol/HDL Ratio: 2.5 ratio (ref 0.0–4.4)
Cholesterol, Total: 187 mg/dL (ref 100–199)
HDL: 74 mg/dL (ref >39)
LDL Chol Calc (NIH): 93 mg/dL (ref 0–99)
Triglycerides: 116 mg/dL (ref 0–149)
VLDL Cholesterol Cal: 20 mg/dL (ref 5–40)

## 2020-07-02 LAB — TSH: TSH: 2.75 u[IU]/mL (ref 0.450–4.500)

## 2020-07-02 LAB — HEPATITIS C ANTIBODY: Hep C Virus Ab: <0.1 s/co ratio (ref 0.0–0.9)

## 2020-07-14 ENCOUNTER — Telehealth (INDEPENDENT_AMBULATORY_CARE_PROVIDER_SITE_OTHER): Payer: Self-pay | Admitting: Gastroenterology

## 2020-07-14 ENCOUNTER — Other Ambulatory Visit: Payer: Self-pay

## 2020-07-14 DIAGNOSIS — Z1211 Encounter for screening for malignant neoplasm of colon: Secondary | ICD-10-CM

## 2020-07-14 MED ORDER — NA SULFATE-K SULFATE-MG SULF 17.5-3.13-1.6 GM/177ML PO SOLN: 1.0000 | Freq: Once | ORAL | 0 refills | Status: AC

## 2020-07-14 NOTE — Progress Notes (Signed)
Gastroenterology Pre-Procedure Review  Request Date: 08/01/20 Requesting Physician: Dr. Allen Norris  PATIENT REVIEW QUESTIONS: The patient responded to the following health history questions as indicated:    1. Are you having any GI issues? no 2. Do you have a personal history of Polyps? no 3. Do you have a family history of Colon Cancer or Polyps? no 4. Diabetes Mellitus? no 5. Joint replacements in the past 12 months?no 6. Major health problems in the past 3 months?no 7. Any artificial heart valves, MVP, or defibrillator?no    MEDICATIONS & ALLERGIES:    Patient reports the following regarding taking any anticoagulation/antiplatelet therapy:   Plavix, Coumadin, Eliquis, Xarelto, Lovenox, Pradaxa, Brilinta, or Effient? no Aspirin? no  Patient confirms/reports the following medications:  Current Outpatient Medications  Medication Sig Dispense Refill  . aspirin EC 81 MG tablet Take 81 mg by mouth daily. Swallow whole.    . calcium-vitamin D (OSCAL WITH D) 500-200 MG-UNIT tablet Take 1 tablet by mouth.    Marland Kitchen lisinopril (ZESTRIL) 40 MG tablet Take 0.5 tablets (20 mg total) by mouth every morning. 45 tablet 1  . metoprolol succinate (TOPROL-XL) 50 MG 24 hr tablet Half a tablet (25 MG) 90 tablet 1  . Na Sulfate-K Sulfate-Mg Sulf 17.5-3.13-1.6 GM/177ML SOLN Take 1 kit by mouth once for 1 dose. 354 mL 0  . rosuvastatin (CRESTOR) 40 MG tablet Take 1 tablet (40 mg total) by mouth daily. 90 tablet 1   No current facility-administered medications for this visit.    Patient confirms/reports the following allergies:  Allergies  Allergen Reactions  . Penicillins Hives  . Ezetimibe     No orders of the defined types were placed in this encounter.   AUTHORIZATION INFORMATION Primary Insurance: 1D#: Group #:  Secondary Insurance: 1D#: Group #:  SCHEDULE INFORMATION: Date: 08/01/20 Time: Location:[facility]

## 2020-07-29 DIAGNOSIS — H43812 Vitreous degeneration, left eye: Secondary | ICD-10-CM | POA: Diagnosis not present

## 2020-07-29 DIAGNOSIS — Z0100 Encounter for examination of eyes and vision without abnormal findings: Secondary | ICD-10-CM | POA: Diagnosis not present

## 2020-07-31 ENCOUNTER — Encounter: Payer: Self-pay | Admitting: Gastroenterology

## 2020-07-31 ENCOUNTER — Other Ambulatory Visit: Payer: Self-pay

## 2020-07-31 DIAGNOSIS — Z1211 Encounter for screening for malignant neoplasm of colon: Secondary | ICD-10-CM

## 2020-08-01 ENCOUNTER — Encounter
Admission: RE | Disposition: A | Discharge status: Home / Self Care | Payer: Self-pay | Source: Home / Self Care | Attending: Gastroenterology

## 2020-08-01 ENCOUNTER — Other Ambulatory Visit: Payer: Self-pay

## 2020-08-01 ENCOUNTER — Ambulatory Visit
Admission: RE | Admit: 2020-08-01 | Discharge: 2020-08-01 | Disposition: A | Discharge status: Home / Self Care | Payer: Medicare HMO | Attending: Gastroenterology | Admitting: Gastroenterology

## 2020-08-01 ENCOUNTER — Encounter: Payer: Self-pay | Admitting: Gastroenterology

## 2020-08-01 ENCOUNTER — Ambulatory Visit: Payer: Medicare HMO | Admitting: Anesthesiology

## 2020-08-01 DIAGNOSIS — Z87891 Personal history of nicotine dependence: Secondary | ICD-10-CM | POA: Insufficient documentation

## 2020-08-01 DIAGNOSIS — Z888 Allergy status to other drugs, medicaments and biological substances status: Secondary | ICD-10-CM | POA: Diagnosis not present

## 2020-08-01 DIAGNOSIS — Z1211 Encounter for screening for malignant neoplasm of colon: Secondary | ICD-10-CM | POA: Insufficient documentation

## 2020-08-01 DIAGNOSIS — K640 First degree hemorrhoids: Secondary | ICD-10-CM | POA: Diagnosis not present

## 2020-08-01 DIAGNOSIS — Z880 Allergy status to penicillin: Secondary | ICD-10-CM | POA: Diagnosis not present

## 2020-08-01 DIAGNOSIS — Z85820 Personal history of malignant melanoma of skin: Secondary | ICD-10-CM | POA: Diagnosis not present

## 2020-08-01 HISTORY — PX: COLONOSCOPY WITH PROPOFOL: SHX5780

## 2020-08-01 SURGERY — COLONOSCOPY WITH PROPOFOL
Anesthesia: General

## 2020-08-01 MED ORDER — LIDOCAINE HCL (CARDIAC) PF 100 MG/5ML IV SOSY
PREFILLED_SYRINGE | INTRAVENOUS | Status: DC | PRN
  Administered 2020-08-01: 60 mg via INTRAVENOUS

## 2020-08-01 MED ORDER — PROPOFOL 10 MG/ML IV BOLUS
INTRAVENOUS | Status: DC | PRN
  Administered 2020-08-01: 50 mg via INTRAVENOUS
  Administered 2020-08-01: 30 mg via INTRAVENOUS
  Administered 2020-08-01: 20 mg via INTRAVENOUS
  Administered 2020-08-01 (×3): 30 mg via INTRAVENOUS

## 2020-08-01 MED ORDER — STERILE WATER FOR IRRIGATION IR SOLN
Status: DC | PRN
  Administered 2020-08-01: 150 mL

## 2020-08-01 MED ORDER — LACTATED RINGERS IV SOLN: INTRAVENOUS | Status: DC

## 2020-08-01 MED ORDER — SODIUM CHLORIDE 0.9 % IV SOLN: INTRAVENOUS | Status: DC

## 2020-08-01 SURGICAL SUPPLY — 22 items

## 2020-08-01 NOTE — Anesthesia Procedure Notes (Signed)
Date/Time: 08/01/2020 11:26 AM Performed by: Dionne Bucy, CRNA Pre-anesthesia Checklist: Patient identified, Emergency Drugs available, Suction available, Patient being monitored and Timeout performed Patient Re-evaluated:Patient Re-evaluated prior to induction Oxygen Delivery Method: Nasal cannula Placement Confirmation: positive ETCO2

## 2020-08-01 NOTE — H&P (Signed)
Samantha Lame, MD [facility] [address] [place], [place] [address] Phone: [phone] Fax : [phone]  Primary Care Physician:  Glean Hess, MD Primary Gastroenterologist:  Dr. Allen Norris  Pre-Procedure History & Physical: HPI:  Samantha Hodge is a 75 y.o. female is here for a screening colonoscopy.   Past Medical History:  Diagnosis Date  . Cancer ([place])    melanoma  . Hyperlipidemia   . Hypertension     Past Surgical History:  Procedure Laterality Date  . ABDOMINAL HYSTERECTOMY    . ANTERIOR CRUCIATE LIGAMENT REPAIR     Right KNEE  . BREAST BIOPSY Left    stereo bx/clip-neg  . BREAST CYST ASPIRATION Left    fna- neg  . ECTOPIC PREGNANCY SURGERY    . EXCISION OF ATRIAL MYXOMA  2005  . KNEE CARTILAGE SURGERY     meniscus removed - left knee  . MELANOMA EXCISION     Left Arm  . OOPHORECTOMY      Prior to Admission medications   Medication Sig Start Date End Date Taking? Authorizing Provider  aspirin EC 81 MG tablet Take 81 mg by mouth daily. Swallow whole.   Yes [provider]  calcium-vitamin D (OSCAL WITH D) 500-200 MG-UNIT tablet Take 1 tablet by mouth.   Yes [provider]  lisinopril (ZESTRIL) 40 MG tablet Take 0.5 tablets (20 mg total) by mouth every morning. 07/01/20  Yes Glean Hess, MD  metoprolol succinate (TOPROL-XL) 50 MG 24 hr tablet Half a tablet (25 MG) 07/01/20  Yes Glean Hess, MD  rosuvastatin (CRESTOR) 40 MG tablet Take 1 tablet (40 mg total) by mouth daily. 07/01/20  Yes Glean Hess, MD    Allergies as of 07/31/2020 - Review Complete 07/31/2020  Allergen Reaction Noted  . Penicillins Hives 01/02/2020  . Ezetimibe  07/01/2020    Family History  Problem Relation Age of Onset  . Breast cancer Mother   . Anuerysm Daughter   . Heart disease Maternal Grandfather     Social History   Socioeconomic History  . Marital status: Married    Spouse name: Not on file  . Number of children: Not on file  .  Years of education: Not on file  . Highest education level: Not on file  Occupational History  . Not on file  Tobacco Use  . Smoking status: Former Smoker    Packs/day: 0.08    Years: 20.00    Pack years: 1.60    Types: Cigarettes    Quit date: 2007    Years since quitting: 15.3  . Smokeless tobacco: Never Used  Vaping Use  . Vaping Use: Never used  Substance and Sexual Activity  . Alcohol use: Yes    Alcohol/week: 14.0 standard drinks    Types: 14 Glasses of wine per week  . Drug use: Never  . Sexual activity: Yes  Other Topics Concern  . Not on file  Social History Narrative  . Not on file   Social Determinants of Health   Financial Resource Strain: Not on file  Food Insecurity: Not on file  Transportation Needs: Not on file  Physical Activity: Not on file  Stress: Not on file  Social Connections: Not on file  Intimate Partner Violence: Not on file    Review of Systems: See HPI, otherwise negative ROS  Physical Exam: BP 125/73   Pulse 65   Temp 97.6 F (36.4 C) (Temporal)   Ht 5\' 2"  (1.575 m)  Wt 73 kg   SpO2 99%   BMI 29.45 kg/m  General:   Alert,  pleasant and cooperative in NAD Head:  Normocephalic and atraumatic. Neck:  Supple; no masses or thyromegaly. Lungs:  Clear throughout to auscultation.    Heart:  Regular rate and rhythm. Abdomen:  Soft, nontender and nondistended. Normal bowel sounds, without guarding, and without rebound.   Neurologic:  Alert and  oriented x4;  grossly normal neurologically.  Impression/Plan: Samantha Hodge is now here to undergo a screening colonoscopy.  Risks, benefits, and alternatives regarding colonoscopy have been reviewed with the patient.  Questions have been answered.  All parties agreeable.

## 2020-08-01 NOTE — Anesthesia Postprocedure Evaluation (Addendum)
Anesthesia Post Note  Patient: Samantha Hodge  Procedure(s) Performed: COLONOSCOPY WITH PROPOFOL (N/A )     Patient location during evaluation: PACU Anesthesia Type: General Level of consciousness: awake Pain management: pain level controlled Vital Signs Assessment: post-procedure vital signs reviewed and stable Respiratory status: respiratory function stable Cardiovascular status: stable Postop Assessment: no signs of nausea or vomiting Anesthetic complications: no   No complications documented.  Veda Canning

## 2020-08-01 NOTE — Op Note (Signed)
[facility] Patient Name: Samantha Hodge Procedure Date: 08/01/2020 11:21 AM MRN: [id number] Account #: [id number] Date of Birth: 04-Nov-1945 Admit Type: Outpatient Age: 75 Room: [facility] OR ROOM 01 Gender: Female Note Status: Finalized Procedure:             Colonoscopy Indications:           Screening for colorectal malignant neoplasm Providers:             Lucilla Lame MD, MD Referring MD:          Halina Maidens, MD (Referring MD) Medicines:             Propofol per Anesthesia Complications:         No immediate complications. Procedure:             Pre-Anesthesia Assessment:                        - Prior to the procedure, a History and Physical was                         performed, and patient medications and allergies were                         reviewed. The patient's tolerance of previous                         anesthesia was also reviewed. The risks and benefits                         of the procedure and the sedation options and risks                         were discussed with the patient. All questions were                         answered, and informed consent was obtained. Prior                         Anticoagulants: The patient has taken no previous                         anticoagulant or antiplatelet agents. ASA Grade                         Assessment: II - A patient with mild systemic disease.                         After reviewing the risks and benefits, the patient                         was deemed in satisfactory condition to undergo the                         procedure.                        After obtaining informed consent, the colonoscope was  passed under direct vision. Throughout the procedure,                         the patient's blood pressure, pulse, and oxygen                         saturations were monitored continuously. The                         Colonoscope was introduced through the  anus and                         advanced to the the cecum, identified by appendiceal                         orifice and ileocecal valve. The colonoscopy was                         performed without difficulty. The patient tolerated                         the procedure well. The quality of the bowel                         preparation was excellent. Findings:      The perianal and digital rectal examinations were normal.      Non-bleeding internal hemorrhoids were found during retroflexion. The       hemorrhoids were Grade I (internal hemorrhoids that do not prolapse). Impression:            - Non-bleeding internal hemorrhoids.                        - No specimens collected. Recommendation:        - Discharge patient to home.                        - Resume previous diet.                        - Continue present medications.                        - Repeat colonoscopy is not recommended due to current                         age (63 years or older) for screening purposes. Procedure Code(s):     --- Professional ---                        [phone], Colonoscopy, flexible; diagnostic, including                         collection of specimen(s) by brushing or washing, when                         performed (separate procedure) Diagnosis Code(s):     --- Professional ---                        Z12.11, Encounter for screening  for malignant neoplasm                         of colon CPT copyright 2019 American Medical [employer]. All rights reserved. The codes documented in this report are preliminary and upon coder review may  be revised to meet current compliance requirements. Lucilla Lame MD, MD 08/01/2020 11:49:28 AM This report has been signed electronically. Number of Addenda: 0 Note Initiated On: 08/01/2020 11:21 AM Scope Withdrawal Time: 0 hours 8 minutes 23 seconds  Total Procedure Duration: 0 hours 15 minutes 17 seconds  Estimated Blood Loss:  Estimated blood loss: none.       [facility]

## 2020-08-01 NOTE — Anesthesia Postprocedure Evaluation (Deleted)
Anesthesia Post Note  Patient: Samantha Hodge  Procedure(s) Performed: COLONOSCOPY WITH PROPOFOL (N/A )     Anesthesia Post Evaluation No complications documented.  Veda Canning

## 2020-08-01 NOTE — Transfer of Care (Signed)
Immediate Anesthesia Transfer of Care Note  Patient: Samantha Hodge  Procedure(s) Performed: COLONOSCOPY WITH PROPOFOL (N/A )  Patient Location: PACU  Anesthesia Type: General  Level of Consciousness: awake, alert  and patient cooperative  Airway and Oxygen Therapy: Patient Spontanous Breathing and Patient connected to supplemental oxygen  Post-op Assessment: Post-op Vital signs reviewed, Patient's Cardiovascular Status Stable, Respiratory Function Stable, Patent Airway and No signs of Nausea or vomiting  Post-op Vital Signs: Reviewed and stable  Complications: No complications documented.

## 2020-08-01 NOTE — Anesthesia Preprocedure Evaluation (Signed)
Anesthesia Evaluation  Patient identified by MRN, date of birth, ID band Patient awake    Reviewed: Allergy & Precautions, NPO status   Airway Mallampati: II  TM Distance: >3 FB     Dental   Pulmonary former smoker,    Pulmonary exam normal        Cardiovascular hypertension,  Rhythm:Regular Rate:Normal  HLD   Neuro/Psych  Headaches,    GI/Hepatic   Endo/Other    Renal/GU      Musculoskeletal   Abdominal   Peds  Hematology   Anesthesia Other Findings   Reproductive/Obstetrics                             Anesthesia Physical Anesthesia Plan  ASA: II  Anesthesia Plan: General   Post-op Pain Management:    Induction: Intravenous  PONV Risk Score and Plan: Propofol infusion, TIVA and Treatment may vary due to age or medical condition  Airway Management Planned: Natural Airway and Nasal Cannula  Additional Equipment:   Intra-op Plan:   Post-operative Plan:   Informed Consent: I have reviewed the patients History and Physical, chart, labs and discussed the procedure including the risks, benefits and alternatives for the proposed anesthesia with the patient or authorized representative who has indicated his/her understanding and acceptance.       Plan Discussed with: CRNA  Anesthesia Plan Comments:         Anesthesia Quick Evaluation

## 2020-08-29 DIAGNOSIS — H43812 Vitreous degeneration, left eye: Secondary | ICD-10-CM | POA: Diagnosis not present

## 2020-09-03 ENCOUNTER — Other Ambulatory Visit: Payer: Self-pay | Admitting: Internal Medicine

## 2020-09-03 DIAGNOSIS — I10 Essential (primary) hypertension: Secondary | ICD-10-CM

## 2020-09-03 MED ORDER — LISINOPRIL 40 MG PO TABS: 20.0000 mg | ORAL_TABLET | Freq: Every morning | ORAL | 1 refills | Status: DC

## 2020-09-03 NOTE — Telephone Encounter (Signed)
Medication Refill - Medication: lisinopril (ZESTRIL) 40 MG tablet    Preferred Pharmacy (with phone number or street name): [id number] - [place], [place]: Please be advised that RX refills may take up to 3 business days. We ask that you follow-up with your pharmacy.

## 2020-09-24 ENCOUNTER — Telehealth: Payer: Self-pay | Admitting: Internal Medicine

## 2020-09-24 ENCOUNTER — Other Ambulatory Visit: Payer: Self-pay | Admitting: Internal Medicine

## 2020-09-24 DIAGNOSIS — I10 Essential (primary) hypertension: Secondary | ICD-10-CM

## 2020-09-24 MED ORDER — LISINOPRIL 40 MG PO TABS: 40.0000 mg | ORAL_TABLET | Freq: Every morning | ORAL | 1 refills | Status: DC

## 2020-09-24 NOTE — Telephone Encounter (Signed)
  Relation to pt: self  Call back number: [phone]    Reason for call:    Patient was advised by PCP okay to take 1 tablet a day instead of 0.5 tablet daily of lisinopril (ZESTRIL) 40 MG tablet and to update PCP. Patient states when she was taking 0.5 tablet her BP readings reflected the below   148/80 148/86  Patient increased to 1 tablet and the first reading was   149/77  110/73   Patient would like PCP to send in a 90 day supply supply reflecting 1 tablet daily. Patient is going out of town in the next week and would like rx sent in to   [id number] - [place], [place] Phone:  [phone]  Fax:  [phone]

## 2020-09-24 NOTE — Telephone Encounter (Signed)
Called pt left her know that a refill for 1 tablet a day was sent in. Pt verbalized understanding.  KP

## 2020-09-24 NOTE — Telephone Encounter (Signed)
Please review.  KP

## 2020-10-11 ENCOUNTER — Other Ambulatory Visit: Payer: Self-pay | Admitting: Internal Medicine

## 2020-10-11 DIAGNOSIS — E782 Mixed hyperlipidemia: Secondary | ICD-10-CM

## 2020-10-11 NOTE — Telephone Encounter (Signed)
Future in 2 months

## 2020-11-04 ENCOUNTER — Other Ambulatory Visit: Payer: Self-pay | Admitting: Internal Medicine

## 2020-11-04 DIAGNOSIS — I10 Essential (primary) hypertension: Secondary | ICD-10-CM

## 2020-11-04 NOTE — Telephone Encounter (Signed)
Requested medication (s) are due for refill today: Yes  Requested medication (s) are on the active medication list: Yes  Last refill:  06/11/20  Future visit scheduled: Yes  Notes to clinic:  Unable to refill per protocol, needs new Rx sent, see pharmacy highlighted notes      Requested Prescriptions  Pending Prescriptions Disp Refills   metoprolol succinate (TOPROL-XL) 50 MG 24 hr tablet [Pharmacy Med Name: METOPROLOL SUCC ER 50 MG TAB] 90 tablet 1    Sig: TAKE 0.5 TABLET BY MOUTH      Cardiovascular:  Beta Blockers Failed - 11/04/2020  9:31 AM      Failed - Last BP in normal range    BP Readings from Last 1 Encounters:  08/01/20 (!) 165/92          Passed - Last Heart Rate in normal range    Pulse Readings from Last 1 Encounters:  08/01/20 (!) 58          Passed - Valid encounter within last 6 months    Recent Outpatient Visits           4 months ago Annual physical exam   [facility] Glean Hess, MD   5 months ago Acute cystitis with hematuria   [facility] Glean Hess, MD   10 months ago Essential hypertension   [facility] Glean Hess, MD       Future Appointments             In 1 month Army Melia Jesse Sans, MD [facility], [place]   In 8 months Army Melia, Jesse Sans, MD [facility], [facility]

## 2020-12-14 ENCOUNTER — Telehealth: Payer: Self-pay | Admitting: *Deleted

## 2020-12-14 NOTE — Telephone Encounter (Signed)
Staff called patient to schedule appt for her AWV. Pt declined and stated she's been to appts like this before and what she have learned is that if she have any questions and problem she will just address them with Dr. Army Melia during her appt time. Staff tried informing that her appt for 12-18-20 is different from the appt that's staff was trying to schedule. Per pt she don't think she will be doing the appt and will address any issues/problem with PCP during her visit. Staff verbalized understanding.

## 2020-12-18 ENCOUNTER — Other Ambulatory Visit: Payer: Self-pay

## 2020-12-18 ENCOUNTER — Encounter: Payer: Self-pay | Admitting: Internal Medicine

## 2020-12-18 ENCOUNTER — Ambulatory Visit (INDEPENDENT_AMBULATORY_CARE_PROVIDER_SITE_OTHER): Payer: Medicare HMO | Admitting: Internal Medicine

## 2020-12-18 VITALS — BP 135/72 | HR 71 | Temp 97.7°F | Ht 62.0 in | Wt 164.0 lb

## 2020-12-18 DIAGNOSIS — Z23 Encounter for immunization: Secondary | ICD-10-CM

## 2020-12-18 DIAGNOSIS — I10 Essential (primary) hypertension: Secondary | ICD-10-CM

## 2020-12-18 NOTE — Progress Notes (Signed)
Date:  12/18/2020   Name:  Samantha Hodge   DOB:  1945/09/08   MRN:  [id number]   Chief Complaint: Hypertension and Flu Vaccine  Hypertension This is a chronic problem. The problem has been gradually improving (since increasing dose of lisinopril to 40 mg) since onset. The problem is controlled (at home 115-135/70-85). Pertinent negatives include no chest pain, headaches, palpitations or shortness of breath. Past treatments include ACE inhibitors and beta blockers. The current treatment provides significant improvement. There are no compliance problems.  Hypertensive end-organ damage includes CAD/MI. There is no history of kidney disease or CVA.   Lab Results  Component Value Date   CREATININE 0.6 05/16/2020   BUN 12 05/16/2020   NA 139 05/16/2020   K 4.5 05/16/2020   CL 107 05/16/2020   CO2 25 (A) 05/16/2020   Lab Results  Component Value Date   CHOL 187 07/01/2020   HDL 74 07/01/2020   LDLCALC 93 07/01/2020   TRIG 116 07/01/2020   CHOLHDL 2.5 07/01/2020   Lab Results  Component Value Date   TSH 2.750 07/01/2020   No results found for: HGBA1C Lab Results  Component Value Date   WBC 8.7 07/01/2020   HGB 14.2 07/01/2020   HCT 43.8 07/01/2020   MCV 86 07/01/2020   PLT 326 07/01/2020   Lab Results  Component Value Date   ALT 40 (A) 05/16/2020   AST 37 (A) 05/16/2020   ALKPHOS 59 05/16/2020   BILITOT 0.5 01/02/2020     Review of Systems  Constitutional:  Negative for fatigue and unexpected weight change.  HENT:  Negative for nosebleeds.   Eyes:  Negative for visual disturbance.  Respiratory:  Negative for cough, chest tightness, shortness of breath and wheezing.   Cardiovascular:  Negative for chest pain, palpitations and leg swelling.  Gastrointestinal:  Negative for abdominal pain, constipation and diarrhea.  Skin:        Nail changes on both second toes  Neurological:  Negative for dizziness, weakness, light-headedness and headaches.   Patient Active Problem  List   Diagnosis Date Noted   Ocular migraine 07/01/2020   Meningioma ([place]) 05/20/2020   Essential hypertension 01/02/2020   Mixed hyperlipidemia 01/02/2020   Hx of melanoma of skin 01/02/2020   History of myxoma 07/20/2005    Allergies  Allergen Reactions   Penicillins Hives   Ezetimibe     Past Surgical History:  Procedure Laterality Date   ABDOMINAL HYSTERECTOMY     ANTERIOR CRUCIATE LIGAMENT REPAIR     Right KNEE   BREAST BIOPSY Left    stereo bx/clip-neg   BREAST CYST ASPIRATION Left    fna- neg   COLONOSCOPY WITH PROPOFOL N/A 08/01/2020   Procedure: COLONOSCOPY WITH PROPOFOL;  Surgeon: Lucilla Lame, MD;  Location: [place];  Service: Endoscopy;  Laterality: N/A;   ECTOPIC PREGNANCY SURGERY     EXCISION OF ATRIAL MYXOMA  2005   KNEE CARTILAGE SURGERY     meniscus removed - left knee   MELANOMA EXCISION     Left Arm   OOPHORECTOMY      Social History   Tobacco Use   Smoking status: Former    Packs/day: 0.08    Years: 20.00    Pack years: 1.60    Types: Cigarettes    Quit date: 2007    Years since quitting: 15.7   Smokeless tobacco: Never  Vaping Use   Vaping Use: Never used  Substance Use Topics  Alcohol use: Yes    Alcohol/week: 14.0 standard drinks    Types: 14 Glasses of wine per week   Drug use: Never     Medication list has been reviewed and updated.  Current Meds  Medication Sig   aspirin EC 81 MG tablet Take 81 mg by mouth daily. Swallow whole.   calcium-vitamin D (OSCAL WITH D) 500-200 MG-UNIT tablet Take 1 tablet by mouth.   lisinopril (ZESTRIL) 40 MG tablet Take 1 tablet (40 mg total) by mouth every morning.   metoprolol succinate (TOPROL-XL) 50 MG 24 hr tablet TAKE 1 TABLET BY MOUTH   rosuvastatin (CRESTOR) 40 MG tablet TAKE 1 TABLET BY MOUTH EVERY DAY    PHQ 2/9 Scores 12/18/2020 07/01/2020 05/20/2020 01/02/2020  PHQ - 2 Score 0 0 0 0  PHQ- 9 Score 0 0 0 0    GAD 7 : Generalized Anxiety Score 12/18/2020 07/01/2020 05/20/2020  01/02/2020  Nervous, Anxious, on Edge 0 0 1 0  Control/stop worrying 0 0 0 0  Worry too much - different things 0 0 0 0  Trouble relaxing 0 0 0 0  Restless 0 0 0 0  Easily annoyed or irritable 0 0 0 0  Afraid - awful might happen 0 0 0 0  Total GAD 7 Score 0 0 1 0  Anxiety Difficulty - - - Not difficult at all    BP Readings from Last 3 Encounters:  12/18/20 135/72  08/01/20 (!) 165/92  07/01/20 (!) 148/86    Physical Exam Vitals and nursing note reviewed.  Constitutional:      General: She is not in acute distress.    Appearance: She is well-developed.  HENT:     Head: Normocephalic and atraumatic.  Cardiovascular:     Rate and Rhythm: Normal rate and regular rhythm.     Pulses: Normal pulses.     Heart sounds: No murmur heard. Pulmonary:     Effort: Pulmonary effort is normal. No respiratory distress.     Breath sounds: No wheezing or rhonchi.  Musculoskeletal:     Cervical back: Normal range of motion.     Right lower leg: No edema.     Left lower leg: No edema.  Lymphadenopathy:     Cervical: No cervical adenopathy.  Skin:    General: Skin is warm and dry.     Findings: No rash.  Neurological:     General: No focal deficit present.     Mental Status: She is alert and oriented to person, place, and time.  Psychiatric:        Mood and Affect: Mood normal.        Behavior: Behavior normal.    Wt Readings from Last 3 Encounters:  12/18/20 164 lb (74.4 kg)  08/01/20 161 lb (73 kg)  07/01/20 164 lb (74.4 kg)    BP 135/72 Comment: at home  Pulse 71   Temp 97.7 F (36.5 C) (Oral)   Ht '5\' 2"'$  (1.575 m)   Wt 164 lb (74.4 kg)   SpO2 98%   BMI 30.00 kg/m   Assessment and Plan: 1. Essential hypertension Clinically stable exam with well controlled BP at home.  Highly suspect a component of anxiety causing high readings today. Tolerating medications without side effects at this time. Recent labs normal. Pt to continue current regimen and low sodium diet;  regular exercise. Follow up if not controlled at home.  2. Need for immunization against influenza - Flu Vaccine QUAD High Dose(Fluad)  Partially dictated using Editor, commissioning. Any errors are unintentional.  Halina Maidens, MD [facility]  12/18/2020

## 2021-01-13 ENCOUNTER — Other Ambulatory Visit: Payer: Self-pay | Admitting: Internal Medicine

## 2021-01-13 DIAGNOSIS — Z1231 Encounter for screening mammogram for malignant neoplasm of breast: Secondary | ICD-10-CM

## 2021-02-12 ENCOUNTER — Other Ambulatory Visit: Payer: Self-pay

## 2021-02-12 ENCOUNTER — Ambulatory Visit
Admission: RE | Admit: 2021-02-12 | Discharge: 2021-02-12 | Disposition: A | Discharge status: Home / Self Care | Payer: Medicare HMO | Source: Ambulatory Visit | Attending: Internal Medicine | Admitting: Internal Medicine

## 2021-02-12 DIAGNOSIS — Z1231 Encounter for screening mammogram for malignant neoplasm of breast: Secondary | ICD-10-CM | POA: Diagnosis not present

## 2021-04-04 ENCOUNTER — Other Ambulatory Visit: Payer: Self-pay | Admitting: Internal Medicine

## 2021-04-04 DIAGNOSIS — I10 Essential (primary) hypertension: Secondary | ICD-10-CM

## 2021-04-04 NOTE — Telephone Encounter (Signed)
Requested medication (s) are due for refill today: yes  Requested medication (s) are on the active medication list: yes  Last refill:  09/24/20 #90 1 RF  Future visit scheduled: yes  Notes to clinic:  overdue lab work   Requested Prescriptions  Pending Prescriptions Disp Refills   lisinopril (ZESTRIL) 40 MG tablet [Pharmacy Med Name: LISINOPRIL 40 MG TABLET] 90 tablet 1    Sig: TAKE 1 TABLET BY MOUTH EVERY DAY IN THE MORNING     Cardiovascular:  ACE Inhibitors Failed - 04/04/2021  9:25 AM      Failed - Cr in normal range and within 180 days    Creatinine  Date Value Ref Range Status  05/16/2020 0.6 0.5 - 1.1 Final   Creatinine, Ser  Date Value Ref Range Status  01/02/2020 0.65 0.57 - 1.00 mg/dL Final          Failed - K in normal range and within 180 days    Potassium  Date Value Ref Range Status  05/16/2020 4.5 3.4 - 5.3 Final          Passed - Patient is not pregnant      Passed - Last BP in normal range    BP Readings from Last 1 Encounters:  12/18/20 135/72          Passed - Valid encounter within last 6 months    Recent Outpatient Visits           3 months ago Essential hypertension   [facility] Glean Hess, MD   9 months ago Annual physical exam   [facility] Glean Hess, MD   10 months ago Acute cystitis with hematuria   [facility] Glean Hess, MD   1 year ago Essential hypertension   [place], Laura H, MD       Future Appointments             In 3 months Army Melia Jesse Sans, MD [facility], [facility]

## 2021-04-07 ENCOUNTER — Other Ambulatory Visit: Payer: Self-pay | Admitting: Internal Medicine

## 2021-04-07 DIAGNOSIS — E782 Mixed hyperlipidemia: Secondary | ICD-10-CM

## 2021-04-08 NOTE — Telephone Encounter (Signed)
Requested Prescriptions  Pending Prescriptions Disp Refills   rosuvastatin (CRESTOR) 40 MG tablet [Pharmacy Med Name: ROSUVASTATIN CALCIUM 40 MG TAB] 90 tablet 0    Sig: TAKE 1 TABLET BY MOUTH EVERY DAY     Cardiovascular:  Antilipid - Statins Passed - 04/07/2021 10:03 AM      Passed - Total Cholesterol in normal range and within 360 days    Cholesterol, Total  Date Value Ref Range Status  07/01/2020 187 100 - 199 mg/dL Final         Passed - LDL in normal range and within 360 days    LDL Chol Calc (NIH)  Date Value Ref Range Status  07/01/2020 93 0 - 99 mg/dL Final         Passed - HDL in normal range and within 360 days    HDL  Date Value Ref Range Status  07/01/2020 74 >39 mg/dL Final         Passed - Triglycerides in normal range and within 360 days    Triglycerides  Date Value Ref Range Status  07/01/2020 116 0 - 149 mg/dL Final         Passed - Patient is not pregnant      Passed - Valid encounter within last 12 months    Recent Outpatient Visits          3 months ago Essential hypertension   [facility] Glean Hess, MD   9 months ago Annual physical exam   [facility] Glean Hess, MD   10 months ago Acute cystitis with hematuria   [facility] Glean Hess, MD   1 year ago Essential hypertension   [facility] Glean Hess, MD      Future Appointments            In 2 months Army Melia Jesse Sans, MD [facility], [facility]

## 2021-06-16 DIAGNOSIS — L570 Actinic keratosis: Secondary | ICD-10-CM | POA: Diagnosis not present

## 2021-06-16 DIAGNOSIS — L853 Xerosis cutis: Secondary | ICD-10-CM | POA: Diagnosis not present

## 2021-06-16 DIAGNOSIS — L578 Other skin changes due to chronic exposure to nonionizing radiation: Secondary | ICD-10-CM | POA: Diagnosis not present

## 2021-06-16 DIAGNOSIS — L905 Scar conditions and fibrosis of skin: Secondary | ICD-10-CM | POA: Diagnosis not present

## 2021-06-16 DIAGNOSIS — D225 Melanocytic nevi of trunk: Secondary | ICD-10-CM | POA: Diagnosis not present

## 2021-06-16 DIAGNOSIS — L821 Other seborrheic keratosis: Secondary | ICD-10-CM | POA: Diagnosis not present

## 2021-06-16 DIAGNOSIS — Z859 Personal history of malignant neoplasm, unspecified: Secondary | ICD-10-CM | POA: Diagnosis not present

## 2021-07-03 ENCOUNTER — Encounter: Payer: Self-pay | Admitting: Internal Medicine

## 2021-07-03 ENCOUNTER — Ambulatory Visit (INDEPENDENT_AMBULATORY_CARE_PROVIDER_SITE_OTHER): Payer: Medicare HMO | Admitting: Internal Medicine

## 2021-07-03 VITALS — BP 132/70 | HR 89 | Ht 62.0 in | Wt 167.8 lb

## 2021-07-03 DIAGNOSIS — D329 Benign neoplasm of meninges, unspecified: Secondary | ICD-10-CM

## 2021-07-03 DIAGNOSIS — Z0000 Encounter for general adult medical examination without abnormal findings: Secondary | ICD-10-CM | POA: Diagnosis not present

## 2021-07-03 DIAGNOSIS — E782 Mixed hyperlipidemia: Secondary | ICD-10-CM | POA: Diagnosis not present

## 2021-07-03 DIAGNOSIS — I10 Essential (primary) hypertension: Secondary | ICD-10-CM

## 2021-07-03 DIAGNOSIS — R7309 Other abnormal glucose: Secondary | ICD-10-CM | POA: Diagnosis not present

## 2021-07-03 DIAGNOSIS — R946 Abnormal results of thyroid function studies: Secondary | ICD-10-CM | POA: Diagnosis not present

## 2021-07-03 LAB — POCT URINALYSIS DIPSTICK
Bilirubin, UA: NEGATIVE
Blood, UA: NEGATIVE
Glucose, UA: NEGATIVE
Ketones, UA: NEGATIVE
Leukocytes, UA: NEGATIVE
Nitrite, UA: NEGATIVE
Protein, UA: NEGATIVE
Spec Grav, UA: 1.01 (ref 1.010–1.025)
Urobilinogen, UA: 0.2 E.U./dL
pH, UA: 6 (ref 5.0–8.0)

## 2021-07-03 MED ORDER — METOPROLOL SUCCINATE ER 50 MG PO TB24: ORAL_TABLET | ORAL | 1 refills | Status: DC

## 2021-07-03 MED ORDER — LISINOPRIL 40 MG PO TABS: ORAL_TABLET | ORAL | 1 refills | Status: DC

## 2021-07-03 NOTE — Progress Notes (Signed)
? ? ?Date:  07/03/2021  ? ?Name:  Samantha Hodge   DOB:  12-05-1945   MRN:  [id number] ? ? ?Chief Complaint: Annual Exam (Breast Exam. UA. Needs to schedule AWV with [facility].) ?Samantha Hodge is a 76 y.o. female who presents today for her Complete Annual Exam. She feels well. She reports walking. She reports she is sleeping well. Breast complaints - none.  She is planning a trip in June to [place] with her family. ? ?Mammogram: 02/2021 ?DEXA: none ?Colonoscopy: 07/2020 normal ? ?Health Maintenance Due  ?Topic Date Due  ? Zoster Vaccines- Shingrix (1 of 2) Never done  ? DEXA SCAN  Never done  ?  ?Immunization History  ?Administered Date(s) Administered  ? Fluad Quad(high Dose 65+) 01/28/2020, 12/18/2020  ? PFIZER(Purple Top)SARS-COV-2 Vaccination 05/03/2019, 05/24/2019, 03/24/2020  ? Pneumococcal Conjugate-13 04/05/2017  ? Pneumococcal Polysaccharide-23 04/05/2018  ? Tdap 03/20/2019  ? Tetanus 03/14/2008  ? ? ?Hypertension ?This is a chronic problem. The problem is controlled. Pertinent negatives include no chest pain, headaches, palpitations or shortness of breath. Past treatments include ACE inhibitors and beta blockers. There are no compliance problems.  There is no history of kidney disease, CAD/MI or CVA.  ?Hyperlipidemia ?This is a chronic problem. The problem is controlled. Pertinent negatives include no chest pain or shortness of breath. Current antihyperlipidemic treatment includes statins.  ?Meningioma - noted incidentally last year.  She has been asymptomatic and after discussion with neurology, she elected not to repeat the MRI unless needed. ? ?Lab Results  ?Component Value Date  ? NA 139 05/16/2020  ? K 4.5 05/16/2020  ? CO2 25 (A) 05/16/2020  ? GLUCOSE 80 01/02/2020  ? BUN 12 05/16/2020  ? CREATININE 0.6 05/16/2020  ? CALCIUM 10.2 01/02/2020  ? GFRNONAA 87 01/02/2020  ? ?Lab Results  ?Component Value Date  ? CHOL 187 07/01/2020  ? HDL 74 07/01/2020  ? [name] 93 07/01/2020  ? TRIG 116 07/01/2020  ? CHOLHDL  2.5 07/01/2020  ? ?Lab Results  ?Component Value Date  ? TSH 2.750 07/01/2020  ? ?No results found for: HGBA1C ?Lab Results  ?Component Value Date  ? WBC 8.7 07/01/2020  ? HGB 14.2 07/01/2020  ? HCT 43.8 07/01/2020  ? MCV 86 07/01/2020  ? PLT 326 07/01/2020  ? ?Lab Results  ?Component Value Date  ? ALT 40 (A) 05/16/2020  ? AST 37 (A) 05/16/2020  ? ALKPHOS 59 05/16/2020  ? BILITOT 0.5 01/02/2020  ? ?No results found for: 25OHVITD2, [place], VD25OH  ? ?Review of Systems  ?Constitutional:  Negative for chills, fatigue and fever.  ?HENT:  Negative for congestion, hearing loss, tinnitus, trouble swallowing and voice change.   ?Eyes:  Negative for visual disturbance.  ?Respiratory:  Negative for cough, chest tightness, shortness of breath and wheezing.   ?Cardiovascular:  Negative for chest pain, palpitations and leg swelling.  ?Gastrointestinal:  Negative for abdominal pain, constipation, diarrhea and vomiting.  ?Endocrine: Negative for polydipsia and polyuria.  ?Genitourinary:  Negative for dysuria, frequency, genital sores, vaginal bleeding and vaginal discharge.  ?Musculoskeletal:  Negative for arthralgias, gait problem and joint swelling.  ?Skin:  Negative for color change and rash.  ?Neurological:  Negative for dizziness, tremors, light-headedness and headaches.  ?Hematological:  Negative for adenopathy. Does not bruise/bleed easily.  ?Psychiatric/Behavioral:  Negative for dysphoric mood and sleep disturbance. The patient is not nervous/anxious.   ? ?Patient Active Problem List  ? Diagnosis Date Noted  ? Ocular migraine 07/01/2020  ? Meningioma ([place])  05/20/2020  ? Essential hypertension 01/02/2020  ? Mixed hyperlipidemia 01/02/2020  ? Hx of melanoma of skin 01/02/2020  ? History of myxoma 07/20/2005  ? ? ?Allergies  ?Allergen Reactions  ? Penicillins Hives  ? Ezetimibe   ? ? ?Past Surgical History:  ?Procedure Laterality Date  ? ABDOMINAL HYSTERECTOMY    ? ANTERIOR CRUCIATE LIGAMENT REPAIR    ? Right KNEE  ?  BREAST BIOPSY Left   ? stereo bx/clip-neg  ? BREAST CYST ASPIRATION Left   ? fna- neg  ? COLONOSCOPY WITH PROPOFOL N/A 08/01/2020  ? Procedure: COLONOSCOPY WITH PROPOFOL;  Surgeon: Lucilla Lame, MD;  Location: [place];  Service: Endoscopy;  Laterality: N/A;  ? ECTOPIC PREGNANCY SURGERY    ? EXCISION OF ATRIAL MYXOMA  2005  ? KNEE CARTILAGE SURGERY    ? meniscus removed - left knee  ? MELANOMA EXCISION    ? Left Arm  ? OOPHORECTOMY    ? ? ?Social History  ? ?Tobacco Use  ? Smoking status: Former  ?  Packs/day: 0.08  ?  Years: 20.00  ?  Pack years: 1.60  ?  Types: Cigarettes  ?  Quit date: 2007  ?  Years since quitting: 16.2  ? Smokeless tobacco: Never  ?Vaping Use  ? Vaping Use: Never used  ?Substance Use Topics  ? Alcohol use: Yes  ?  Alcohol/week: 14.0 standard drinks  ?  Types: 14 Glasses of wine per week  ? Drug use: Never  ? ? ? ?Medication list has been reviewed and updated. ? ?Current Meds  ?Medication Sig  ? aspirin EC 81 MG tablet Take 81 mg by mouth daily. Swallow whole.  ? calcium-vitamin D (OSCAL WITH D) 500-200 MG-UNIT tablet Take 1 tablet by mouth.  ? lisinopril (ZESTRIL) 40 MG tablet TAKE 1 TABLET BY MOUTH EVERY DAY IN THE MORNING  ? metoprolol succinate (TOPROL-XL) 50 MG 24 hr tablet TAKE 1 TABLET BY MOUTH  ? rosuvastatin (CRESTOR) 40 MG tablet TAKE 1 TABLET BY MOUTH EVERY DAY  ? ? ? ?  12/18/2020  ?  8:25 AM 07/01/2020  ?  9:13 AM 05/20/2020  ?  8:00 AM 01/02/2020  ?  3:02 PM  ?GAD 7 : Generalized Anxiety Score  ?Nervous, Anxious, on Edge 0 0 1 0  ?Control/stop worrying 0 0 0 0  ?Worry too much - different things 0 0 0 0  ?Trouble relaxing 0 0 0 0  ?Restless 0 0 0 0  ?Easily annoyed or irritable 0 0 0 0  ?Afraid - awful might happen 0 0 0 0  ?Total GAD 7 Score 0 0 1 0  ?Anxiety Difficulty    Not difficult at all  ? ? ? ?  12/18/2020  ?  8:25 AM  ?Depression screen PHQ 2/9  ?Decreased Interest 0  ?Down, Depressed, Hopeless 0  ?PHQ - 2 Score 0  ?Altered sleeping 0  ?Tired, decreased energy 0   ?Change in appetite 0  ?Feeling bad or failure about yourself  0  ?Trouble concentrating 0  ?Moving slowly or fidgety/restless 0  ?Suicidal thoughts 0  ?PHQ-9 Score 0  ?Difficult doing work/chores Not difficult at all  ? ? ?BP Readings from Last 3 Encounters:  ?07/03/21 132/70  ?12/18/20 135/72  ?08/01/20 (!) 165/92  ? ? ?Physical Exam ?Vitals and nursing note reviewed.  ?Constitutional:   ?   General: She is not in acute distress. ?   Appearance: She is well-developed.  ?HENT:  ?  Head: Normocephalic and atraumatic.  ?   Right Ear: Tympanic membrane and ear canal normal.  ?   Left Ear: Tympanic membrane and ear canal normal.  ?   Nose:  ?   Right Sinus: No maxillary sinus tenderness.  ?   Left Sinus: No maxillary sinus tenderness.  ?Eyes:  ?   General: No scleral icterus.    ?   Right eye: No discharge.     ?   Left eye: No discharge.  ?   Conjunctiva/sclera: Conjunctivae normal.  ?Neck:  ?   Thyroid: No thyromegaly.  ?   Vascular: No carotid bruit.  ?Cardiovascular:  ?   Rate and Rhythm: Normal rate and regular rhythm.  ?   Pulses: Normal pulses.  ?   Heart sounds: Normal heart sounds.  ?Pulmonary:  ?   Effort: Pulmonary effort is normal. No respiratory distress.  ?   Breath sounds: No wheezing.  ?Chest:  ?Breasts: ?   Right: No mass, nipple discharge, skin change or tenderness.  ?   Left: No mass, nipple discharge, skin change or tenderness.  ?Abdominal:  ?   General: Bowel sounds are normal.  ?   Palpations: Abdomen is soft.  ?   Tenderness: There is no abdominal tenderness.  ?Musculoskeletal:  ?   Cervical back: Normal range of motion. No erythema.  ?   Right lower leg: No edema.  ?   Left lower leg: No edema.  ?Lymphadenopathy:  ?   Cervical: No cervical adenopathy.  ?Skin: ?   General: Skin is warm and dry.  ?   Findings: No rash.  ?Neurological:  ?   Mental Status: She is alert and oriented to person, place, and time.  ?   Cranial Nerves: No cranial nerve deficit.  ?   Sensory: No sensory deficit.  ?    Deep Tendon Reflexes: Reflexes are normal and symmetric.  ?Psychiatric:     ?   Attention and Perception: Attention normal.     ?   Mood and Affect: Mood normal.  ? ? ?Wt Readings from Last 3 Encounters:  ?03/31

## 2021-07-03 NOTE — Patient Instructions (Signed)
How to Take Your Blood Pressure ?Blood pressure measures how strongly your blood is pressing against the walls of your arteries. Arteries are blood vessels that carry blood from your heart throughout your body. You can take your blood pressure at home with a machine. ?You may need to check your blood pressure at home: ?To check if you have high blood pressure (hypertension). ?To check your blood pressure over time. ?To make sure your blood pressure medicine is working. ?Supplies needed: ?Blood pressure machine, or monitor. ?Dining room chair to sit in. ?Table or desk. ?Small notebook. ?Pencil or pen. ?How to prepare ?Avoid these things for 30 minutes before checking your blood pressure: ?Having drinks with caffeine in them, such as coffee or tea. ?Drinking alcohol. ?Eating. ?Smoking. ?Exercising. ?Do these things five minutes before checking your blood pressure: ?Go to the bathroom and pee (urinate). ?Sit in a dining chair. Do not sit on a soft couch or an armchair. ?Be quiet. Do not talk. ?How to take your blood pressure ?Follow the instructions that came with your machine. If you have a digital blood pressure monitor, these may be the instructions: ?Sit up straight. ?Place your feet on the floor. Do not cross your ankles or legs. ?Rest your left arm at the level of your heart. You may rest it on a table, desk, or chair. ?Pull up your shirt sleeve. ?Wrap the blood pressure cuff around the upper part of your left arm. The cuff should be 1 inch (2.5 cm) above your elbow. It is best to wrap the cuff around bare skin. ?Fit the cuff snugly around your arm. You should be able to place only one finger between the cuff and your arm. ?Place the cord so that it rests in the bend of your elbow. ?Press the power button. ?Sit quietly while the cuff fills with air and loses air. ?Write down the numbers on the screen. ?Wait 2-3 minutes and then repeat steps 1-10. ?What do the numbers mean? ?Two numbers make up your blood  pressure. The first number is called systolic pressure. The second is called diastolic pressure. An example of a blood pressure reading is "120 over 80" (or 120/80). ?If you are an adult and do not have a medical condition, use this guide to find out if your blood pressure is normal: ?Normal ?First number: below 120. ?Second number: below 80. ?Elevated ?First number: 120-129. ?Second number: below 80. ?Hypertension stage 1 ?First number: 130-139. ?Second number: 80-89. ?Hypertension stage 2 ?First number: 140 or above. ?Second number: 90 or above. ?Your blood pressure is above normal even if only the first or only the second number is above normal. ?Follow these instructions at home: ?Medicines ?Take over-the-counter and prescription medicines only as told by your doctor. ?Tell your doctor if your medicine is causing side effects. ?General instructions ?Check your blood pressure as often as your doctor tells you to. ?Check your blood pressure at the same time every day. ?Take your monitor to your next doctor's appointment. Your doctor will: ?Make sure you are using it correctly. ?Make sure it is working right. ?Understand what your blood pressure numbers should be. ?Keep all follow-up visits as told by your doctor. This is important. ?General tips ?You will need a blood pressure machine, or monitor. Your doctor can suggest a monitor. You can buy one at a drugstore or online. When choosing one: ?Choose one with an arm cuff. ?Choose one that wraps around your upper arm. Only one finger should fit between   your arm and the cuff. ?Do not choose one that measures your blood pressure from your wrist or finger. ?Where to find more information ?American Heart Association: www.heart.org ?Contact a doctor if: ?Your blood pressure keeps being high. ?Your blood pressure is suddenly low. ?Get help right away if: ?Your first blood pressure number is higher than 180. ?Your second blood pressure number is higher than  120. ?Summary ?Check your blood pressure at the same time every day. ?Avoid caffeine, alcohol, smoking, and exercise for 30 minutes before checking your blood pressure. ?Make sure you understand what your blood pressure numbers should be. ?This information is not intended to replace advice given to you by your health care provider. Make sure you discuss any questions you have with your health care provider. ?Document Revised: 01/30/2020 Document Reviewed: 03/16/2019 ?Elsevier Patient Education ? 2022 Elsevier Inc. ? ?

## 2021-07-04 LAB — COMPREHENSIVE METABOLIC PANEL
ALT: 27 IU/L (ref 0–32)
AST: 26 IU/L (ref 0–40)
Albumin/Globulin Ratio: 1.8 (ref 1.2–2.2)
Albumin: 4.4 g/dL (ref 3.7–4.7)
Alkaline Phosphatase: 62 IU/L (ref 44–121)
BUN/Creatinine Ratio: 20 (ref 12–28)
BUN: 16 mg/dL (ref 8–27)
Bilirubin Total: 0.7 mg/dL (ref 0.0–1.2)
CO2: 24 mmol/L (ref 20–29)
Calcium: 10.2 mg/dL (ref 8.7–10.3)
Chloride: 101 mmol/L (ref 96–106)
Creatinine, Ser: 0.81 mg/dL (ref 0.57–1.00)
Globulin, Total: 2.4 g/dL (ref 1.5–4.5)
Glucose: 99 mg/dL (ref 70–99)
Potassium: 4.8 mmol/L (ref 3.5–5.2)
Sodium: 140 mmol/L (ref 134–144)
Total Protein: 6.8 g/dL (ref 6.0–8.5)
eGFR: 75 mL/min/{1.73_m2} (ref >59)

## 2021-07-04 LAB — CBC WITH DIFFERENTIAL/PLATELET
Basophils Absolute: 0.1 10*3/uL (ref 0.0–0.2)
Basos: 1 %
EOS (ABSOLUTE): 0.1 10*3/uL (ref 0.0–0.4)
Eos: 1 %
Hematocrit: 44 % (ref 34.0–46.6)
Hemoglobin: 14.5 g/dL (ref 11.1–15.9)
Immature Grans (Abs): 0 10*3/uL (ref 0.0–0.1)
Immature Granulocytes: 0 %
Lymphocytes Absolute: 1.8 10*3/uL (ref 0.7–3.1)
Lymphs: 32 %
MCH: 28.8 pg (ref 26.6–33.0)
MCHC: 33 g/dL (ref 31.5–35.7)
MCV: 87 fL (ref 79–97)
Monocytes Absolute: 0.6 10*3/uL (ref 0.1–0.9)
Monocytes: 11 %
Neutrophils Absolute: 3 10*3/uL (ref 1.4–7.0)
Neutrophils: 55 %
Platelets: 271 10*3/uL (ref 150–450)
RBC: 5.04 x10E6/uL (ref 3.77–5.28)
RDW: 12.6 % (ref 11.7–15.4)
WBC: 5.6 10*3/uL (ref 3.4–10.8)

## 2021-07-04 LAB — LIPID PANEL
Chol/HDL Ratio: 3.2 ratio (ref 0.0–4.4)
Cholesterol, Total: 205 mg/dL — ABNORMAL HIGH (ref 100–199)
HDL: 65 mg/dL (ref >39)
LDL Chol Calc (NIH): 115 mg/dL — ABNORMAL HIGH (ref 0–99)
Triglycerides: 145 mg/dL (ref 0–149)
VLDL Cholesterol Cal: 25 mg/dL (ref 5–40)

## 2021-07-04 LAB — TSH: TSH: 8.68 u[IU]/mL — ABNORMAL HIGH (ref 0.450–4.500)

## 2021-07-08 ENCOUNTER — Encounter: Payer: Self-pay | Admitting: Internal Medicine

## 2021-07-08 LAB — T4: T4, Total: 6.3 ug/dL (ref 4.5–12.0)

## 2021-07-08 LAB — SPECIMEN STATUS REPORT

## 2021-08-22 ENCOUNTER — Other Ambulatory Visit: Payer: Self-pay | Admitting: Internal Medicine

## 2021-08-22 DIAGNOSIS — E782 Mixed hyperlipidemia: Secondary | ICD-10-CM

## 2021-08-24 NOTE — Telephone Encounter (Signed)
Requested Prescriptions  Pending Prescriptions Disp Refills  . rosuvastatin (CRESTOR) 40 MG tablet [Pharmacy Med Name: ROSUVASTATIN CALCIUM 40 MG TAB] 90 tablet 0    Sig: TAKE 1 TABLET BY MOUTH EVERY DAY     Cardiovascular:  Antilipid - Statins 2 Failed - 08/22/2021  9:13 AM      Failed - Lipid Panel in normal range within the last 12 months    Cholesterol, Total  Date Value Ref Range Status  07/03/2021 205 (H) 100 - 199 mg/dL Final   LDL Chol Calc (NIH)  Date Value Ref Range Status  07/03/2021 115 (H) 0 - 99 mg/dL Final   HDL  Date Value Ref Range Status  07/03/2021 65 >39 mg/dL Final   Triglycerides  Date Value Ref Range Status  07/03/2021 145 0 - 149 mg/dL Final         Passed - Cr in normal range and within 360 days    Creatinine, Ser  Date Value Ref Range Status  07/03/2021 0.81 0.57 - 1.00 mg/dL Final         Passed - Patient is not pregnant      Passed - Valid encounter within last 12 months    Recent Outpatient Visits          1 month ago Annual physical exam   [facility] Glean Hess, MD   8 months ago Essential hypertension   [facility] Glean Hess, MD   1 year ago Annual physical exam   [facility] Glean Hess, MD   1 year ago Acute cystitis with hematuria   [facility] Glean Hess, MD   1 year ago Essential hypertension   [facility] Glean Hess, MD      Future Appointments            In 4 months Army Melia Jesse Sans, MD [facility], [place]   In 10 months Army Melia Jesse Sans, MD [facility]

## 2021-09-01 DIAGNOSIS — Z0100 Encounter for examination of eyes and vision without abnormal findings: Secondary | ICD-10-CM | POA: Diagnosis not present

## 2021-09-01 DIAGNOSIS — H43812 Vitreous degeneration, left eye: Secondary | ICD-10-CM | POA: Diagnosis not present

## 2021-09-03 DIAGNOSIS — H2513 Age-related nuclear cataract, bilateral: Secondary | ICD-10-CM | POA: Diagnosis not present

## 2021-09-03 DIAGNOSIS — H2512 Age-related nuclear cataract, left eye: Secondary | ICD-10-CM | POA: Diagnosis not present

## 2021-09-07 ENCOUNTER — Encounter: Payer: Self-pay | Admitting: Ophthalmology

## 2021-09-28 ENCOUNTER — Ambulatory Visit
Admission: RE | Admit: 2021-09-28 | Discharge: 2021-09-28 | Disposition: A | Discharge status: Home / Self Care | Payer: Medicare HMO | Attending: Ophthalmology | Admitting: Ophthalmology

## 2021-09-28 ENCOUNTER — Encounter: Payer: Self-pay | Admitting: Ophthalmology

## 2021-09-28 ENCOUNTER — Other Ambulatory Visit: Payer: Self-pay

## 2021-09-28 ENCOUNTER — Encounter
Admission: RE | Disposition: A | Discharge status: Home / Self Care | Payer: Self-pay | Source: Home / Self Care | Attending: Ophthalmology

## 2021-09-28 ENCOUNTER — Ambulatory Visit: Payer: Medicare HMO | Admitting: Anesthesiology

## 2021-09-28 DIAGNOSIS — Z87891 Personal history of nicotine dependence: Secondary | ICD-10-CM | POA: Insufficient documentation

## 2021-09-28 DIAGNOSIS — H2512 Age-related nuclear cataract, left eye: Secondary | ICD-10-CM | POA: Diagnosis not present

## 2021-09-28 DIAGNOSIS — E782 Mixed hyperlipidemia: Secondary | ICD-10-CM

## 2021-09-28 DIAGNOSIS — Z6831 Body mass index (BMI) 31.0-31.9, adult: Secondary | ICD-10-CM | POA: Diagnosis not present

## 2021-09-28 DIAGNOSIS — E785 Hyperlipidemia, unspecified: Secondary | ICD-10-CM | POA: Insufficient documentation

## 2021-09-28 DIAGNOSIS — E669 Obesity, unspecified: Secondary | ICD-10-CM | POA: Diagnosis not present

## 2021-09-28 DIAGNOSIS — I10 Essential (primary) hypertension: Secondary | ICD-10-CM | POA: Diagnosis not present

## 2021-09-28 DIAGNOSIS — H25812 Combined forms of age-related cataract, left eye: Secondary | ICD-10-CM | POA: Diagnosis not present

## 2021-09-28 HISTORY — PX: CATARACT EXTRACTION W/PHACO: SHX586

## 2021-09-28 SURGERY — PHACOEMULSIFICATION, CATARACT, WITH IOL INSERTION
Anesthesia: Monitor Anesthesia Care | Site: Eye | Laterality: Left

## 2021-09-28 MED ORDER — FENTANYL CITRATE (PF) 100 MCG/2ML IJ SOLN
INTRAMUSCULAR | Status: DC | PRN
  Administered 2021-09-28: 50 ug via INTRAVENOUS

## 2021-09-28 MED ORDER — SIGHTPATH DOSE#1 BSS IO SOLN
INTRAOCULAR | Status: DC | PRN
  Administered 2021-09-28: 72 mL via OPHTHALMIC

## 2021-09-28 MED ORDER — MOXIFLOXACIN HCL 0.5 % OP SOLN
OPHTHALMIC | Status: DC | PRN
  Administered 2021-09-28: 0.2 mL via OPHTHALMIC

## 2021-09-28 MED ORDER — SIGHTPATH DOSE#1 SODIUM HYALURONATE 10 MG/ML IO SOLUTION
PREFILLED_SYRINGE | INTRAOCULAR | Status: DC | PRN
  Administered 2021-09-28: 0.85 mL via INTRAOCULAR

## 2021-09-28 MED ORDER — LACTATED RINGERS IV SOLN: INTRAVENOUS | Status: DC

## 2021-09-28 MED ORDER — TETRACAINE HCL 0.5 % OP SOLN
1.0000 [drp] | OPHTHALMIC | Status: DC | PRN
  Administered 2021-09-28 (×3): 1 [drp] via OPHTHALMIC

## 2021-09-28 MED ORDER — ACETAMINOPHEN 160 MG/5ML PO SOLN: 325.0000 mg | ORAL | Status: DC | PRN

## 2021-09-28 MED ORDER — ONDANSETRON HCL 4 MG/2ML IJ SOLN
INTRAMUSCULAR | Status: DC | PRN
  Administered 2021-09-28: 4 mg via INTRAVENOUS

## 2021-09-28 MED ORDER — LIDOCAINE HCL (PF) 2 % IJ SOLN
INTRAOCULAR | Status: DC | PRN
  Administered 2021-09-28: 1 mL via INTRAOCULAR

## 2021-09-28 MED ORDER — MIDAZOLAM HCL 2 MG/2ML IJ SOLN
INTRAMUSCULAR | Status: DC | PRN
  Administered 2021-09-28: 2 mg via INTRAVENOUS

## 2021-09-28 MED ORDER — SIGHTPATH DOSE#1 SODIUM HYALURONATE 23 MG/ML IO SOLUTION
PREFILLED_SYRINGE | INTRAOCULAR | Status: DC | PRN
  Administered 2021-09-28: 0.6 mL via INTRAOCULAR

## 2021-09-28 MED ORDER — ARMC OPHTHALMIC DILATING DROPS
1.0000 "application " | OPHTHALMIC | Status: DC | PRN
  Administered 2021-09-28 (×3): 1 via OPHTHALMIC

## 2021-09-28 MED ORDER — SIGHTPATH DOSE#1 BSS IO SOLN
INTRAOCULAR | Status: DC | PRN
  Administered 2021-09-28: 15 mL

## 2021-09-28 MED ORDER — ACETAMINOPHEN 325 MG PO TABS: 650.0000 mg | ORAL_TABLET | ORAL | Status: DC | PRN

## 2021-09-28 MED ORDER — ONDANSETRON HCL 4 MG/2ML IJ SOLN: 4.0000 mg | Freq: Once | INTRAMUSCULAR | Status: DC | PRN

## 2021-09-28 SURGICAL SUPPLY — 16 items
CATARACT SUITE SIGHTPATH (MISCELLANEOUS) ×2 IMPLANT
DISSECTOR HYDRO NUCLEUS 50X22 (MISCELLANEOUS) ×2 IMPLANT
FEE CATARACT SUITE SIGHTPATH (MISCELLANEOUS) ×1 IMPLANT
GLOVE SURG GAMMEX PI TX LF 7.5 (GLOVE) ×2 IMPLANT
GLOVE SURG SYN 8.5  E (GLOVE) ×1
GLOVE SURG SYN 8.5 E (GLOVE) ×1 IMPLANT
GLOVE SURG SYN 8.5 PF PI (GLOVE) ×1 IMPLANT
LENS IOL ACRSF VT TRC 315 21.5 IMPLANT
LENS IOL ACRYSOF VIVITY 21.5 ×2 IMPLANT
LENS IOL VIVITY 315 21.5 ×1 IMPLANT
NDL FILTER BLUNT 18X1 1/2 (NEEDLE) ×1 IMPLANT
NEEDLE FILTER BLUNT 18X 1/2SAF (NEEDLE) ×1
NEEDLE FILTER BLUNT 18X1 1/2 (NEEDLE) ×1 IMPLANT
SYR 3ML LL SCALE MARK (SYRINGE) ×2 IMPLANT
SYR 5ML LL (SYRINGE) ×2 IMPLANT
WATER STERILE IRR 250ML POUR (IV SOLUTION) ×2 IMPLANT

## 2021-09-28 NOTE — Transfer of Care (Signed)
Immediate Anesthesia Transfer of Care Note  Patient: Samantha Hodge  Procedure(s) Performed: CATARACT EXTRACTION PHACO AND INTRAOCULAR LENS PLACEMENT (IOC)  LEFT (Left: Eye)  Patient Location: PACU  Anesthesia Type: MAC  Level of Consciousness: awake, alert  and patient cooperative  Airway and Oxygen Therapy: Patient Spontanous Breathing and Patient connected to supplemental oxygen  Post-op Assessment: Post-op Vital signs reviewed, Patient's Cardiovascular Status Stable, Respiratory Function Stable, Patent Airway and No signs of Nausea or vomiting  Post-op Vital Signs: Reviewed and stable  Complications: No notable events documented.

## 2021-09-29 ENCOUNTER — Encounter: Payer: Self-pay | Admitting: Ophthalmology

## 2021-09-29 ENCOUNTER — Other Ambulatory Visit: Payer: Self-pay

## 2021-10-07 DIAGNOSIS — H2511 Age-related nuclear cataract, right eye: Secondary | ICD-10-CM | POA: Diagnosis not present

## 2021-10-08 NOTE — Discharge Instructions (Signed)

## 2021-10-12 ENCOUNTER — Encounter
Admission: RE | Disposition: A | Discharge status: Home / Self Care | Payer: Self-pay | Source: Home / Self Care | Attending: Ophthalmology

## 2021-10-12 ENCOUNTER — Ambulatory Visit: Payer: Medicare HMO | Admitting: Anesthesiology

## 2021-10-12 ENCOUNTER — Other Ambulatory Visit: Payer: Self-pay

## 2021-10-12 ENCOUNTER — Ambulatory Visit
Admission: RE | Admit: 2021-10-12 | Discharge: 2021-10-12 | Disposition: A | Discharge status: Home / Self Care | Payer: Medicare HMO | Attending: Ophthalmology | Admitting: Ophthalmology

## 2021-10-12 DIAGNOSIS — Z85820 Personal history of malignant melanoma of skin: Secondary | ICD-10-CM | POA: Insufficient documentation

## 2021-10-12 DIAGNOSIS — Z87891 Personal history of nicotine dependence: Secondary | ICD-10-CM | POA: Diagnosis not present

## 2021-10-12 DIAGNOSIS — H2511 Age-related nuclear cataract, right eye: Secondary | ICD-10-CM | POA: Diagnosis not present

## 2021-10-12 DIAGNOSIS — I10 Essential (primary) hypertension: Secondary | ICD-10-CM | POA: Diagnosis not present

## 2021-10-12 DIAGNOSIS — R519 Headache, unspecified: Secondary | ICD-10-CM | POA: Diagnosis not present

## 2021-10-12 HISTORY — PX: CATARACT EXTRACTION W/PHACO: SHX586

## 2021-10-12 SURGERY — PHACOEMULSIFICATION, CATARACT, WITH IOL INSERTION
Anesthesia: Monitor Anesthesia Care | Site: Eye | Laterality: Right

## 2021-10-12 MED ORDER — OXYCODONE HCL 5 MG/5ML PO SOLN: 5.0000 mg | Freq: Once | ORAL | Status: DC | PRN

## 2021-10-12 MED ORDER — FENTANYL CITRATE (PF) 100 MCG/2ML IJ SOLN
INTRAMUSCULAR | Status: DC | PRN
  Administered 2021-10-12: 50 ug via INTRAVENOUS

## 2021-10-12 MED ORDER — SIGHTPATH DOSE#1 NA HYALUR & NA CHOND-NA HYALUR IO KIT
PACK | INTRAOCULAR | Status: DC | PRN
  Administered 2021-10-12: 1 via OPHTHALMIC

## 2021-10-12 MED ORDER — FENTANYL CITRATE PF 50 MCG/ML IJ SOSY: 25.0000 ug | PREFILLED_SYRINGE | INTRAMUSCULAR | Status: DC | PRN

## 2021-10-12 MED ORDER — MIDAZOLAM HCL 2 MG/2ML IJ SOLN
INTRAMUSCULAR | Status: DC | PRN
  Administered 2021-10-12: 1 mg via INTRAVENOUS

## 2021-10-12 MED ORDER — LIDOCAINE HCL (PF) 2 % IJ SOLN
INTRAOCULAR | Status: DC | PRN
  Administered 2021-10-12: 1 mL via INTRAOCULAR

## 2021-10-12 MED ORDER — LACTATED RINGERS IV SOLN: INTRAVENOUS | Status: DC

## 2021-10-12 MED ORDER — ARMC OPHTHALMIC DILATING DROPS
1.0000 "application " | OPHTHALMIC | Status: DC | PRN
  Administered 2021-10-12 (×3): 1 via OPHTHALMIC

## 2021-10-12 MED ORDER — SIGHTPATH DOSE#1 BSS IO SOLN
INTRAOCULAR | Status: DC | PRN
  Administered 2021-10-12: 91 mL via OPHTHALMIC

## 2021-10-12 MED ORDER — TETRACAINE HCL 0.5 % OP SOLN
1.0000 [drp] | OPHTHALMIC | Status: DC | PRN
  Administered 2021-10-12 (×3): 1 [drp] via OPHTHALMIC

## 2021-10-12 MED ORDER — MOXIFLOXACIN HCL 0.5 % OP SOLN
OPHTHALMIC | Status: DC | PRN
  Administered 2021-10-12: 0.2 mL via OPHTHALMIC

## 2021-10-12 MED ORDER — SIGHTPATH DOSE#1 BSS IO SOLN
INTRAOCULAR | Status: DC | PRN
  Administered 2021-10-12: 15 mL

## 2021-10-12 MED ORDER — OXYCODONE HCL 5 MG PO TABS: 5.0000 mg | ORAL_TABLET | Freq: Once | ORAL | Status: DC | PRN

## 2021-10-12 SURGICAL SUPPLY — 16 items
CATARACT SUITE SIGHTPATH (MISCELLANEOUS) ×2 IMPLANT
DISSECTOR HYDRO NUCLEUS 50X22 (MISCELLANEOUS) ×2 IMPLANT
FEE CATARACT SUITE SIGHTPATH (MISCELLANEOUS) ×1 IMPLANT
GLOVE SURG GAMMEX PI TX LF 7.5 (GLOVE) ×2 IMPLANT
GLOVE SURG SYN 8.5  E (GLOVE) ×1
GLOVE SURG SYN 8.5 E (GLOVE) ×1 IMPLANT
GLOVE SURG SYN 8.5 PF PI (GLOVE) ×1 IMPLANT
LENS IOL ACRSF VT TRC 415 21.0 IMPLANT
LENS IOL ACRYSOF VIVITY 21.0 ×2 IMPLANT
LENS IOL VIVITY 415 21.0 ×1 IMPLANT
NDL FILTER BLUNT 18X1 1/2 (NEEDLE) ×1 IMPLANT
NEEDLE FILTER BLUNT 18X 1/2SAF (NEEDLE) ×1
NEEDLE FILTER BLUNT 18X1 1/2 (NEEDLE) ×1 IMPLANT
SYR 3ML LL SCALE MARK (SYRINGE) ×2 IMPLANT
SYR 5ML LL (SYRINGE) ×2 IMPLANT
WATER STERILE IRR 250ML POUR (IV SOLUTION) ×2 IMPLANT

## 2021-10-12 NOTE — Transfer of Care (Signed)
Immediate Anesthesia Transfer of Care Note  Patient: Samantha Hodge  Procedure(s) Performed: CATARACT EXTRACTION PHACO AND INTRAOCULAR LENS PLACEMENT (IOC) RIGHT Vivity Lens 3.88 00:34.1 (Right: Eye)  Patient Location: PACU  Anesthesia Type: MAC  Level of Consciousness: awake, alert  and patient cooperative  Airway and Oxygen Therapy: Patient Spontanous Breathing and Patient connected to supplemental oxygen  Post-op Assessment: Post-op Vital signs reviewed, Patient's Cardiovascular Status Stable, Respiratory Function Stable, Patent Airway and No signs of Nausea or vomiting  Post-op Vital Signs: Reviewed and stable  Complications: No notable events documented.

## 2021-10-12 NOTE — Op Note (Signed)
OPERATIVE NOTE  Samantha Hodge [id number] 10/12/2021   PREOPERATIVE DIAGNOSIS:  Nuclear sclerotic cataract right eye.  H25.11   POSTOPERATIVE DIAGNOSIS:    Nuclear sclerotic cataract right eye.     PROCEDURE:  Phacoemusification with posterior chamber intraocular lens placement of the right eye   LENS:   Implant Name Type Inv. Item Serial No. Manufacturer Lot No. LRB No. Used Action  LENS IOL ACRYSOF VIVITY 21.0 - [id number]  LENS IOL ACRYSOF VIVITY 21.0  SIGHT[employer]  Right 1 Implanted       Procedure(s): CATARACT EXTRACTION PHACO AND INTRAOCULAR LENS PLACEMENT (IOC) RIGHT Vivity Lens 3.88 00:34.1 (Right)  [id number] +21.0   ULTRASOUND TIME: 0 minutes 34 seconds.  CDE 3.88   SURGEON:  Benay Pillow, MD, MPH  ANESTHESIOLOGIST: Anesthesiologist: Marice Potter, MD CRNA: Dionne Bucy, CRNA   ANESTHESIA:  Topical with tetracaine drops augmented with 1% preservative-free intracameral lidocaine.  ESTIMATED BLOOD LOSS: less than 1 mL.   COMPLICATIONS:  None.   DESCRIPTION OF PROCEDURE:  The patient was identified in the holding room and transported to the operating room and placed in the supine position under the operating microscope.  The right eye was identified as the operative eye and it was prepped and draped in the usual sterile ophthalmic fashion.  The verion system was registered without difficulty.   A 1.0 millimeter clear-corneal paracentesis was made at the 10:30 position. 0.5 ml of preservative-free 1% lidocaine with epinephrine was injected into the anterior chamber.  The anterior chamber was filled with viscoelastic.  A 2.4 millimeter keratome was used to make a near-clear corneal incision at the 8:00 position.  A curvilinear capsulorrhexis was made with a cystotome and capsulorrhexis forceps.  Balanced salt solution was used to hydrodissect and hydrodelineate the nucleus.   Phacoemulsification was then used in stop and chop fashion to remove the lens nucleus and  epinucleus.  The remaining cortex was then removed using the irrigation and aspiration handpiece. Viscoelastic was then placed into the capsular bag to distend it for lens placement.  A lens was then injected into the capsular bag.  The remaining viscoelastic was aspirated.  The lens was rotated with guidance from the [place] system.   Wounds were hydrated with balanced salt solution.  The anterior chamber was inflated to a physiologic pressure with balanced salt solution. The lens was well centered.  Intracameral vigamox 0.1 mL undiluted was injected into the eye and a drop placed onto the ocular surface.  No wound leaks were noted.  The patient was taken to the recovery room in stable condition without complications of anesthesia or surgery  Benay Pillow 10/12/2021, 10:50 AM

## 2021-10-12 NOTE — H&P (Signed)
[facility]   Primary Care Physician:  Glean Hess, MD Ophthalmologist: Dr. Benay Pillow  Pre-Procedure History & Physical: HPI:  Samantha Hodge is a 76 y.o. female here for cataract surgery.   Past Medical History:  Diagnosis Date   Cancer ([place])    melanoma   Hyperlipidemia    Hypertension     Past Surgical History:  Procedure Laterality Date   ABDOMINAL HYSTERECTOMY     ANTERIOR CRUCIATE LIGAMENT REPAIR     Right KNEE   BREAST BIOPSY Left    stereo bx/clip-neg   BREAST CYST ASPIRATION Left    fna- neg   CATARACT EXTRACTION W/PHACO Left 09/28/2021   Procedure: CATARACT EXTRACTION PHACO AND INTRAOCULAR LENS PLACEMENT (IOC)  LEFT;  Surgeon: Eulogio Bear, MD;  Location: [place];  Service: Ophthalmology;  Laterality: Left;  3.26 00:30.5   COLONOSCOPY WITH PROPOFOL N/A 08/01/2020   Procedure: COLONOSCOPY WITH PROPOFOL;  Surgeon: Lucilla Lame, MD;  Location: [place];  Service: Endoscopy;  Laterality: N/A;   ECTOPIC PREGNANCY SURGERY     EXCISION OF ATRIAL MYXOMA  2005   KNEE CARTILAGE SURGERY     meniscus removed - left knee   MELANOMA EXCISION     Left Arm   OOPHORECTOMY      Prior to Admission medications   Medication Sig Start Date End Date Taking? Authorizing Provider  aspirin EC 81 MG tablet Take 81 mg by mouth daily. Swallow whole.   Yes [provider]  calcium-vitamin D (OSCAL WITH D) 500-200 MG-UNIT tablet Take 1 tablet by mouth.   Yes [provider]  lisinopril (ZESTRIL) 40 MG tablet TAKE 1 TABLET BY MOUTH EVERY DAY IN THE MORNING 07/03/21  Yes Glean Hess, MD  metoprolol succinate (TOPROL-XL) 50 MG 24 hr tablet TAKE 1 TABLET BY MOUTH 07/03/21  Yes Glean Hess, MD  rosuvastatin (CRESTOR) 40 MG tablet TAKE 1 TABLET BY MOUTH EVERY DAY 08/24/21  Yes Glean Hess, MD    Allergies as of 09/02/2021 - Review Complete 07/03/2021  Allergen Reaction Noted   Penicillins Hives 01/02/2020   Ezetimibe   07/01/2020    Family History  Problem Relation Age of Onset   Breast cancer Mother    Anuerysm Daughter    Heart disease Maternal Grandfather     Social History   Socioeconomic History   Marital status: Married    Spouse name: Not on file   Number of children: Not on file   Years of education: Not on file   Highest education level: Not on file  Occupational History   Not on file  Tobacco Use   Smoking status: Former    Packs/day: 0.08    Years: 20.00    Total pack years: 1.60    Types: Cigarettes    Quit date: 2007    Years since quitting: 16.5   Smokeless tobacco: Never  Vaping Use   Vaping Use: Never used  Substance and Sexual Activity   Alcohol use: Yes    Alcohol/week: 14.0 standard drinks of alcohol    Types: 14 Glasses of wine per week   Drug use: Never   Sexual activity: Yes  Other Topics Concern   Not on file  Social History Narrative   Not on file   Social Determinants of Health   Financial Resource Strain: Not on file  Food Insecurity: Not on file  Transportation Needs: Not on file  Physical Activity: Not on file  Stress:  Not on file  Social Connections: Not on file  Intimate Partner Violence: Not on file    Review of Systems: See HPI, otherwise negative ROS  Physical Exam: BP (!) 171/92   Pulse 76   Temp (!) 97 F (36.1 C) (Temporal)   Resp 12   Ht '5\' 2"'$  (1.575 m)   Wt 76.4 kg   BMI 30.80 kg/m  General:   Alert, cooperative in NAD Head:  Normocephalic and atraumatic. Respiratory:  Normal work of breathing. Cardiovascular:  RRR  Impression/Plan: Samantha Hodge is here for cataract surgery.  Risks, benefits, limitations, and alternatives regarding cataract surgery have been reviewed with the patient.  Questions have been answered.  All parties agreeable.   Benay Pillow, MD  10/12/2021, 10:20 AM

## 2021-10-12 NOTE — Anesthesia Preprocedure Evaluation (Signed)
Anesthesia Evaluation  Patient identified by MRN, date of birth, ID band Patient awake    Reviewed: Allergy & Precautions, H&P , NPO status , Patient's Chart, lab work & pertinent test results, reviewed documented beta blocker date and time   Airway Mallampati: II  TM Distance: >3 FB Neck ROM: full    Dental no notable dental hx.    Pulmonary neg pulmonary ROS, former smoker,    Pulmonary exam normal breath sounds clear to auscultation       Cardiovascular Exercise Tolerance: Good hypertension, negative cardio ROS   Rhythm:regular Rate:Normal     Neuro/Psych  Headaches, negative psych ROS   GI/Hepatic negative GI ROS, Neg liver ROS,   Endo/Other  negative endocrine ROS  Renal/GU negative Renal ROS  negative genitourinary   Musculoskeletal   Abdominal   Peds  Hematology Melanoma    Anesthesia Other Findings   Reproductive/Obstetrics negative OB ROS                             Anesthesia Physical Anesthesia Plan  ASA: 2  Anesthesia Plan: MAC   Post-op Pain Management:    Induction:   PONV Risk Score and Plan: Treatment may vary due to age or medical condition  Airway Management Planned:   Additional Equipment:   Intra-op Plan:   Post-operative Plan:   Informed Consent: I have reviewed the patients History and Physical, chart, labs and discussed the procedure including the risks, benefits and alternatives for the proposed anesthesia with the patient or authorized representative who has indicated his/her understanding and acceptance.     Dental Advisory Given  Plan Discussed with: CRNA  Anesthesia Plan Comments:         Anesthesia Quick Evaluation

## 2021-10-12 NOTE — Anesthesia Procedure Notes (Signed)
Procedure Name: MAC Date/Time: 10/12/2021 10:29 AM  Performed by: Dionne Bucy, CRNAPre-anesthesia Checklist: Patient identified, Emergency Drugs available, Suction available, Patient being monitored and Timeout performed Patient Re-evaluated:Patient Re-evaluated prior to induction Oxygen Delivery Method: Nasal cannula Placement Confirmation: positive ETCO2

## 2021-10-12 NOTE — Anesthesia Postprocedure Evaluation (Signed)
Anesthesia Post Note  Patient: Samantha Hodge  Procedure(s) Performed: CATARACT EXTRACTION PHACO AND INTRAOCULAR LENS PLACEMENT (IOC) RIGHT Vivity Lens 3.88 00:34.1 (Right: Eye)     Patient location during evaluation: PACU Anesthesia Type: MAC Level of consciousness: awake and alert Pain management: pain level controlled Vital Signs Assessment: post-procedure vital signs reviewed and stable Respiratory status: spontaneous breathing, nonlabored ventilation, respiratory function stable and patient connected to nasal cannula oxygen Cardiovascular status: stable and blood pressure returned to baseline Postop Assessment: no apparent nausea or vomiting Anesthetic complications: no   No notable events documented.  Jayah Balthazar, Glade Stanford

## 2021-10-13 ENCOUNTER — Encounter: Payer: Self-pay | Admitting: Ophthalmology

## 2021-11-03 DIAGNOSIS — Z961 Presence of intraocular lens: Secondary | ICD-10-CM | POA: Diagnosis not present

## 2021-11-03 DIAGNOSIS — Z0100 Encounter for examination of eyes and vision without abnormal findings: Secondary | ICD-10-CM | POA: Diagnosis not present

## 2021-11-19 ENCOUNTER — Other Ambulatory Visit: Payer: Self-pay | Admitting: Internal Medicine

## 2021-11-19 DIAGNOSIS — E782 Mixed hyperlipidemia: Secondary | ICD-10-CM

## 2021-11-19 NOTE — Telephone Encounter (Signed)
Requested medication (s) are due for refill today: yes  Requested medication (s) are on the active medication list: yes  Last refill:  08/24/21 #90 0 refills  Future visit scheduled: yes in 1 month  Notes to clinic:  no refills remain, do you want to refill Rx?     Requested Prescriptions  Pending Prescriptions Disp Refills   rosuvastatin (CRESTOR) 40 MG tablet [Pharmacy Med Name: ROSUVASTATIN CALCIUM 40 MG TAB] 90 tablet 0    Sig: TAKE 1 TABLET BY MOUTH EVERY DAY     Cardiovascular:  Antilipid - Statins 2 Failed - 11/19/2021  2:23 AM      Failed - Lipid Panel in normal range within the last 12 months    Cholesterol, Total  Date Value Ref Range Status  07/03/2021 205 (H) 100 - 199 mg/dL Final   LDL Chol Calc (NIH)  Date Value Ref Range Status  07/03/2021 115 (H) 0 - 99 mg/dL Final   HDL  Date Value Ref Range Status  07/03/2021 65 >39 mg/dL Final   Triglycerides  Date Value Ref Range Status  07/03/2021 145 0 - 149 mg/dL Final         Passed - Cr in normal range and within 360 days    Creatinine, Ser  Date Value Ref Range Status  07/03/2021 0.81 0.57 - 1.00 mg/dL Final         Passed - Patient is not pregnant      Passed - Valid encounter within last 12 months    Recent Outpatient Visits           4 months ago Annual physical exam   [place] [facility] at [facility], Jesse Sans, MD   11 months ago Essential hypertension   [place] [facility] at [facility], Jesse Sans, MD   1 year ago Annual physical exam   [place] [facility] at [facility], Jesse Sans, MD   1 year ago Acute cystitis with hematuria   [place] [facility] at [facility], Jesse Sans, MD   1 year ago Essential hypertension   [place] [facility] at [facility], Jesse Sans, MD       Future Appointments              In 1 month Army Melia, Jesse Sans, MD [facility] Primary Care and Sports Medicine at [facility], [facility]   In 7 months Army Melia, Jesse Sans, MD [place] [facility] at [facility], [facility]

## 2021-12-23 DIAGNOSIS — D485 Neoplasm of uncertain behavior of skin: Secondary | ICD-10-CM | POA: Diagnosis not present

## 2021-12-23 DIAGNOSIS — C44729 Squamous cell carcinoma of skin of left lower limb, including hip: Secondary | ICD-10-CM | POA: Diagnosis not present

## 2022-01-01 ENCOUNTER — Telehealth: Payer: Self-pay

## 2022-01-01 NOTE — Telephone Encounter (Signed)
Pt came In the office for a weight check. Pt has been on a diet for 30 days. Pt weighs 161 lbs.  KP

## 2022-01-06 ENCOUNTER — Ambulatory Visit (INDEPENDENT_AMBULATORY_CARE_PROVIDER_SITE_OTHER): Payer: Medicare HMO | Admitting: Internal Medicine

## 2022-01-06 ENCOUNTER — Encounter: Payer: Self-pay | Admitting: Internal Medicine

## 2022-01-06 ENCOUNTER — Other Ambulatory Visit: Payer: Self-pay | Admitting: Internal Medicine

## 2022-01-06 VITALS — BP 128/88 | HR 73 | Ht 62.0 in | Wt 161.0 lb

## 2022-01-06 DIAGNOSIS — E782 Mixed hyperlipidemia: Secondary | ICD-10-CM

## 2022-01-06 DIAGNOSIS — I10 Essential (primary) hypertension: Secondary | ICD-10-CM

## 2022-01-06 DIAGNOSIS — C44729 Squamous cell carcinoma of skin of left lower limb, including hip: Secondary | ICD-10-CM

## 2022-01-06 DIAGNOSIS — R7989 Other specified abnormal findings of blood chemistry: Secondary | ICD-10-CM | POA: Diagnosis not present

## 2022-01-06 DIAGNOSIS — Z23 Encounter for immunization: Secondary | ICD-10-CM

## 2022-01-06 DIAGNOSIS — Z1231 Encounter for screening mammogram for malignant neoplasm of breast: Secondary | ICD-10-CM | POA: Diagnosis not present

## 2022-01-06 MED ORDER — METOPROLOL SUCCINATE ER 50 MG PO TB24: ORAL_TABLET | ORAL | 1 refills | Status: DC

## 2022-01-06 NOTE — Progress Notes (Signed)
Date:  01/06/2022   Name:  Samantha Hodge   DOB:  Aug 30, 1945   MRN:  [id number]   Chief Complaint: Hypertension, Flu Vaccine, and breast exam  Hypertension This is a chronic problem. The problem is controlled. Pertinent negatives include no chest pain, headaches, palpitations or shortness of breath. Past treatments include beta blockers and ACE inhibitors. There is no history of kidney disease, CAD/MI or CVA.  Hyperlipidemia This is a chronic problem. The problem is uncontrolled. Recent lipid tests were reviewed and are high. Pertinent negatives include no chest pain or shortness of breath. Current antihyperlipidemic treatment includes statins and diet change. The current treatment provides moderate improvement of lipids.    Lab Results  Component Value Date   NA 140 07/03/2021   K 4.8 07/03/2021   CO2 24 07/03/2021   GLUCOSE 99 07/03/2021   BUN 16 07/03/2021   CREATININE 0.81 07/03/2021   CALCIUM 10.2 07/03/2021   EGFR 75 07/03/2021   GFRNONAA 87 01/02/2020   Lab Results  Component Value Date   CHOL 205 (H) 07/03/2021   HDL 65 07/03/2021   LDLCALC 115 (H) 07/03/2021   TRIG 145 07/03/2021   CHOLHDL 3.2 07/03/2021   Lab Results  Component Value Date   TSH 8.680 (H) 07/03/2021   No results found for: "HGBA1C" Lab Results  Component Value Date   WBC 5.6 07/03/2021   HGB 14.5 07/03/2021   HCT 44.0 07/03/2021   MCV 87 07/03/2021   PLT 271 07/03/2021   Lab Results  Component Value Date   ALT 27 07/03/2021   AST 26 07/03/2021   ALKPHOS 62 07/03/2021   BILITOT 0.7 07/03/2021   No results found for: "25OHVITD2", "25OHVITD3", "VD25OH"   Review of Systems  Constitutional:  Positive for unexpected weight change (lost 8 lbs on GOLO). Negative for fatigue.  HENT:  Negative for nosebleeds.   Eyes:  Negative for visual disturbance.  Respiratory:  Negative for cough, chest tightness, shortness of breath and wheezing.   Cardiovascular:  Negative for chest pain, palpitations  and leg swelling.  Gastrointestinal:  Negative for abdominal pain, constipation and diarrhea.  Musculoskeletal:  Negative for arthralgias and gait problem.  Skin:  Positive for wound (skin cancer excision).  Neurological:  Negative for dizziness, weakness, light-headedness and headaches.    Patient Active Problem List   Diagnosis Date Noted   Elevated TSH 01/06/2022   Ocular migraine 07/01/2020   Meningioma ([place]) 05/20/2020   Essential hypertension 01/02/2020   Mixed hyperlipidemia 01/02/2020   Hx of melanoma of skin 01/02/2020   History of myxoma 07/20/2005    Allergies  Allergen Reactions   Penicillins Hives   Ezetimibe     Past Surgical History:  Procedure Laterality Date   ABDOMINAL HYSTERECTOMY     ANTERIOR CRUCIATE LIGAMENT REPAIR     Right KNEE   BREAST BIOPSY Left    stereo bx/clip-neg   BREAST CYST ASPIRATION Left    fna- neg   CATARACT EXTRACTION W/PHACO Left 09/28/2021   Procedure: CATARACT EXTRACTION PHACO AND INTRAOCULAR LENS PLACEMENT ([place])  LEFT;  Surgeon: Eulogio Bear, MD;  Location: [place];  Service: Ophthalmology;  Laterality: Left;  3.26 00:30.5   CATARACT EXTRACTION W/PHACO Right 10/12/2021   Procedure: CATARACT EXTRACTION PHACO AND INTRAOCULAR LENS PLACEMENT (IOC) RIGHT Vivity Lens 3.88 00:34.1;  Surgeon: Eulogio Bear, MD;  Location: [place];  Service: Ophthalmology;  Laterality: Right;   COLONOSCOPY WITH PROPOFOL N/A 08/01/2020   Procedure: COLONOSCOPY  WITH PROPOFOL;  Surgeon: Lucilla Lame, MD;  Location: [place];  Service: Endoscopy;  Laterality: N/A;   ECTOPIC PREGNANCY SURGERY     EXCISION OF ATRIAL MYXOMA  2005   KNEE CARTILAGE SURGERY     meniscus removed - left knee   MELANOMA EXCISION     Left Arm   OOPHORECTOMY      Social History   Tobacco Use   Smoking status: Former    Packs/day: 0.08    Years: 20.00    Total pack years: 1.60    Types: Cigarettes    Quit date: 2007    Years since  quitting: 16.7   Smokeless tobacco: Never  Vaping Use   Vaping Use: Never used  Substance Use Topics   Alcohol use: Yes    Alcohol/week: 14.0 standard drinks of alcohol    Types: 14 Glasses of wine per week   Drug use: Never     Medication list has been reviewed and updated.  Current Meds  Medication Sig   aspirin EC 81 MG tablet Take 81 mg by mouth daily. Swallow whole.   calcium-vitamin D (OSCAL WITH D) 500-200 MG-UNIT tablet Take 1 tablet by mouth.   lisinopril (ZESTRIL) 40 MG tablet TAKE 1 TABLET BY MOUTH EVERY DAY IN THE MORNING   metoprolol succinate (TOPROL-XL) 50 MG 24 hr tablet TAKE 1 TABLET BY MOUTH   rosuvastatin (CRESTOR) 40 MG tablet TAKE 1 TABLET BY MOUTH EVERY DAY       01/06/2022    9:16 AM 07/03/2021    8:36 AM 12/18/2020    8:25 AM 07/01/2020    9:13 AM  GAD 7 : Generalized Anxiety Score  Nervous, Anxious, on Edge 0 0 0 0  Control/stop worrying 0 0 0 0  Worry too much - different things 0 0 0 0  Trouble relaxing 0 0 0 0  Restless 0 0 0 0  Easily annoyed or irritable 0 0 0 0  Afraid - awful might happen 0 0 0 0  Total GAD 7 Score 0 0 0 0  Anxiety Difficulty Not difficult at all Not difficult at all         01/06/2022    9:15 AM 07/03/2021    8:36 AM 12/18/2020    8:25 AM  Depression screen PHQ 2/9  Decreased Interest 0 0 0  Down, Depressed, Hopeless 0 0 0  PHQ - 2 Score 0 0 0  Altered sleeping 0 0 0  Tired, decreased energy 0 0 0  Change in appetite 0 0 0  Feeling bad or failure about yourself  0 0 0  Trouble concentrating 0 0 0  Moving slowly or fidgety/restless 0 0 0  Suicidal thoughts 0 0 0  PHQ-9 Score 0 0 0  Difficult doing work/chores Not difficult at all Not difficult at all Not difficult at all    BP Readings from Last 3 Encounters:  01/06/22 128/88  10/12/21 135/72  09/28/21 (!) 154/92    Physical Exam Vitals and nursing note reviewed.  Constitutional:      General: She is not in acute distress.    Appearance: She is  well-developed.  HENT:     Head: Normocephalic and atraumatic.  Cardiovascular:     Rate and Rhythm: Normal rate and regular rhythm.     Pulses: Normal pulses.  Pulmonary:     Effort: Pulmonary effort is normal. No respiratory distress.     Breath sounds: No wheezing or rhonchi.  Musculoskeletal:     Cervical back: Normal range of motion.     Right lower leg: No edema.     Left lower leg: No edema.  Skin:    General: Skin is warm and dry.     Findings: No rash.  Neurological:     Mental Status: She is alert and oriented to person, place, and time.  Psychiatric:        Mood and Affect: Mood normal.        Behavior: Behavior normal.     Wt Readings from Last 3 Encounters:  01/06/22 161 lb (73 kg)  01/01/22 161 lb (73 kg)  10/12/21 168 lb 6.4 oz (76.4 kg)    BP 128/88   Pulse 73   Ht 5' 2"  (1.575 m)   Wt 161 lb (73 kg)   SpO2 92%   BMI 29.45 kg/m   Assessment and Plan: 1. Essential hypertension Clinically stable exam with well controlled BP. Tolerating medications without side effects at this time. Pt to continue current regimen and low sodium diet; benefits of regular exercise as able discussed.  Doing well with weight loss and diet. - metoprolol succinate (TOPROL-XL) 50 MG 24 hr tablet; TAKE 1 TABLET BY MOUTH  Dispense: 90 tablet; Refill: 1  2. Mixed hyperlipidemia Continue Crestor and diet changes  3. Elevated TSH Recent T3 and T4 were normal.  4. Need for immunization against influenza - Flu Vaccine QUAD High Dose(Fluad)  5. Encounter for screening mammogram for breast cancer Due next month - MM 3D SCREEN BREAST BILATERAL  6. Squamous cell carcinoma of leg, left Plans for more excision this month.   Partially dictated using Editor, commissioning. Any errors are unintentional.  Halina Maidens, MD [facility]  01/06/2022

## 2022-01-06 NOTE — Telephone Encounter (Signed)
Requested medication (s) are due for refill today: yes  Requested medication (s) are on the active medication list: yes  Last refill:  07/03/21 #90 1 RF  Future visit scheduled: today  Notes to clinic:  pt has appt today. Sending back to office for review.   Requested Prescriptions  Pending Prescriptions Disp Refills   metoprolol succinate (TOPROL-XL) 50 MG 24 hr tablet [Pharmacy Med Name: METOPROLOL SUCC ER 50 MG TAB] 90 tablet 1    Sig: TAKE 1 TABLET BY MOUTH EVERY DAY     Cardiovascular:  Beta Blockers Failed - 01/06/2022  2:42 AM      Failed - Valid encounter within last 6 months    Recent Outpatient Visits           6 months ago Annual physical exam   [place] [facility] at [facility], Jesse Sans, MD   1 year ago Essential hypertension   [place] [facility] at [facility], Jesse Sans, MD   1 year ago Annual physical exam   [place] [facility] at [facility], Jesse Sans, MD   1 year ago Acute cystitis with hematuria   [place] [facility] at [facility], Jesse Sans, MD   2 years ago Essential hypertension   [place] [facility] at [facility], Jesse Sans, MD       Future Appointments             Today Glean Hess, MD [facility] Primary Care and Sports Medicine at [facility], [facility]   In 6 months Glean Hess, MD [facility] Primary Care and Sports Medicine at [facility], [place] BP in normal range    BP Readings from Last 1 Encounters:  10/12/21 135/72         Passed - Last Heart Rate in normal range    Pulse Readings from Last 1 Encounters:  10/12/21 71

## 2022-01-06 NOTE — Patient Instructions (Signed)
Call [facility] to schedule your mammogram at [phone].  

## 2022-01-11 ENCOUNTER — Ambulatory Visit: Payer: Medicare HMO

## 2022-01-14 DIAGNOSIS — C44729 Squamous cell carcinoma of skin of left lower limb, including hip: Secondary | ICD-10-CM | POA: Diagnosis not present

## 2022-01-15 ENCOUNTER — Ambulatory Visit: Payer: Medicare HMO

## 2022-02-14 ENCOUNTER — Other Ambulatory Visit: Payer: Self-pay | Admitting: Internal Medicine

## 2022-02-14 DIAGNOSIS — E782 Mixed hyperlipidemia: Secondary | ICD-10-CM

## 2022-02-15 ENCOUNTER — Ambulatory Visit
Admission: RE | Admit: 2022-02-15 | Discharge: 2022-02-15 | Disposition: A | Discharge status: Home / Self Care | Payer: Medicare HMO | Source: Ambulatory Visit | Attending: Internal Medicine | Admitting: Internal Medicine

## 2022-02-15 DIAGNOSIS — Z1231 Encounter for screening mammogram for malignant neoplasm of breast: Secondary | ICD-10-CM | POA: Insufficient documentation

## 2022-02-15 NOTE — Telephone Encounter (Signed)
Requested Prescriptions  Pending Prescriptions Disp Refills   rosuvastatin (CRESTOR) 40 MG tablet [Pharmacy Med Name: ROSUVASTATIN CALCIUM 40 MG TAB] 90 tablet 1    Sig: TAKE 1 TABLET BY MOUTH EVERY DAY     Cardiovascular:  Antilipid - Statins 2 Failed - 02/14/2022  9:20 AM      Failed - Lipid Panel in normal range within the last 12 months    Cholesterol, Total  Date Value Ref Range Status  07/03/2021 205 (H) 100 - 199 mg/dL Final   LDL Chol Calc (NIH)  Date Value Ref Range Status  07/03/2021 115 (H) 0 - 99 mg/dL Final   HDL  Date Value Ref Range Status  07/03/2021 65 >39 mg/dL Final   Triglycerides  Date Value Ref Range Status  07/03/2021 145 0 - 149 mg/dL Final         Passed - Cr in normal range and within 360 days    Creatinine, Ser  Date Value Ref Range Status  07/03/2021 0.81 0.57 - 1.00 mg/dL Final         Passed - Patient is not pregnant      Passed - Valid encounter within last 12 months    Recent Outpatient Visits           1 month ago Essential hypertension   [place] [facility] at [facility], Jesse Sans, MD   7 months ago Annual physical exam    Primary Care and Sports Medicine at [facility], Jesse Sans, MD   1 year ago Essential hypertension    Primary Care and Sports Medicine at [place], Jesse Sans, MD   1 year ago Annual physical exam   [facility] at [facility], Jesse Sans, MD   1 year ago Acute cystitis with hematuria   [facility] at [facility], Jesse Sans, MD       Future Appointments             In 4 months Army Melia, Jesse Sans, MD [place] [facility] and [facility] Medicine at [facility], [facility]

## 2022-02-19 DIAGNOSIS — C44729 Squamous cell carcinoma of skin of left lower limb, including hip: Secondary | ICD-10-CM | POA: Diagnosis not present

## 2022-03-10 ENCOUNTER — Other Ambulatory Visit: Payer: Self-pay | Admitting: Internal Medicine

## 2022-03-10 DIAGNOSIS — I10 Essential (primary) hypertension: Secondary | ICD-10-CM

## 2022-03-10 NOTE — Telephone Encounter (Signed)
Requested Prescriptions  Pending Prescriptions Disp Refills   lisinopril (ZESTRIL) 40 MG tablet [Pharmacy Med Name: LISINOPRIL 40 MG TABLET] 90 tablet 0    Sig: TAKE 1 TABLET BY MOUTH EVERY DAY IN THE MORNING     Cardiovascular:  ACE Inhibitors Failed - 03/10/2022  1:42 AM      Failed - Cr in normal range and within 180 days    Creatinine, Ser  Date Value Ref Range Status  07/03/2021 0.81 0.57 - 1.00 mg/dL Final         Failed - K in normal range and within 180 days    Potassium  Date Value Ref Range Status  07/03/2021 4.8 3.5 - 5.2 mmol/L Final         Passed - Patient is not pregnant      Passed - Last BP in normal range    BP Readings from Last 1 Encounters:  01/06/22 128/88         Passed - Valid encounter within last 6 months    Recent Outpatient Visits           2 months ago Essential hypertension   [place] [facility] at [facility], Jesse Sans, MD   8 months ago Annual physical exam   [place] [facility] at [facility], Jesse Sans, MD   1 year ago Essential hypertension   [place] [facility] and [facility] Medicine at [facility], Jesse Sans, MD   1 year ago Annual physical exam   [facility] Primary Care and Sports Medicine at [facility], Jesse Sans, MD   1 year ago Acute cystitis with hematuria   [facility] Primary Care and Sports Medicine at [facility], Jesse Sans, MD       Future Appointments             In 3 months Army Melia, Jesse Sans, MD [place] [facility] and Sports Medicine at [facility], [facility]

## 2022-03-12 DIAGNOSIS — L820 Inflamed seborrheic keratosis: Secondary | ICD-10-CM | POA: Diagnosis not present

## 2022-03-12 DIAGNOSIS — L821 Other seborrheic keratosis: Secondary | ICD-10-CM | POA: Diagnosis not present

## 2022-03-12 DIAGNOSIS — D485 Neoplasm of uncertain behavior of skin: Secondary | ICD-10-CM | POA: Diagnosis not present

## 2022-04-13 ENCOUNTER — Telehealth: Payer: Self-pay | Admitting: Internal Medicine

## 2022-04-13 NOTE — Telephone Encounter (Signed)
Spoke with patient she req CB 08/2022 to sched AWV

## 2022-04-28 DIAGNOSIS — C44622 Squamous cell carcinoma of skin of right upper limb, including shoulder: Secondary | ICD-10-CM | POA: Diagnosis not present

## 2022-04-28 DIAGNOSIS — D485 Neoplasm of uncertain behavior of skin: Secondary | ICD-10-CM | POA: Diagnosis not present

## 2022-05-21 DIAGNOSIS — L905 Scar conditions and fibrosis of skin: Secondary | ICD-10-CM | POA: Diagnosis not present

## 2022-05-21 DIAGNOSIS — C44622 Squamous cell carcinoma of skin of right upper limb, including shoulder: Secondary | ICD-10-CM | POA: Diagnosis not present

## 2022-06-04 ENCOUNTER — Other Ambulatory Visit: Payer: Self-pay | Admitting: Internal Medicine

## 2022-06-04 DIAGNOSIS — I10 Essential (primary) hypertension: Secondary | ICD-10-CM

## 2022-06-04 NOTE — Telephone Encounter (Signed)
Requested Prescriptions  Pending Prescriptions Disp Refills   lisinopril (ZESTRIL) 40 MG tablet [Pharmacy Med Name: LISINOPRIL 40 MG TABLET] 90 tablet 0    Sig: TAKE 1 TABLET BY MOUTH EVERY DAY IN THE MORNING     Cardiovascular:  ACE Inhibitors Failed - 06/04/2022  1:54 AM      Failed - Cr in normal range and within 180 days    Creatinine, Ser  Date Value Ref Range Status  07/03/2021 0.81 0.57 - 1.00 mg/dL Final         Failed - K in normal range and within 180 days    Potassium  Date Value Ref Range Status  07/03/2021 4.8 3.5 - 5.2 mmol/L Final         Passed - Patient is not pregnant      Passed - Last BP in normal range    BP Readings from Last 1 Encounters:  01/06/22 128/88         Passed - Valid encounter within last 6 months    Recent Outpatient Visits           4 months ago Essential hypertension   [place] [facility] at [facility], Jesse Sans, MD   11 months ago Annual physical exam   [facility] at [facility], Jesse Sans, MD   1 year ago Essential hypertension   [place] at [place], Jesse Sans, MD   1 year ago Annual physical exam   [place] at [facility], Jesse Sans, MD   2 years ago Acute cystitis with hematuria   [place] at [facility], Jesse Sans, MD       Future Appointments             In 1 month Army Melia, Jesse Sans, MD [place] at [facility], [facility]

## 2022-06-11 DIAGNOSIS — L578 Other skin changes due to chronic exposure to nonionizing radiation: Secondary | ICD-10-CM | POA: Diagnosis not present

## 2022-06-11 DIAGNOSIS — Z859 Personal history of malignant neoplasm, unspecified: Secondary | ICD-10-CM | POA: Diagnosis not present

## 2022-06-11 DIAGNOSIS — L570 Actinic keratosis: Secondary | ICD-10-CM | POA: Diagnosis not present

## 2022-06-11 DIAGNOSIS — Z872 Personal history of diseases of the skin and subcutaneous tissue: Secondary | ICD-10-CM | POA: Diagnosis not present

## 2022-06-11 DIAGNOSIS — Z85820 Personal history of malignant melanoma of skin: Secondary | ICD-10-CM | POA: Diagnosis not present

## 2022-07-02 ENCOUNTER — Other Ambulatory Visit: Payer: Self-pay | Admitting: Internal Medicine

## 2022-07-02 DIAGNOSIS — I10 Essential (primary) hypertension: Secondary | ICD-10-CM

## 2022-07-02 NOTE — Telephone Encounter (Signed)
Requested Prescriptions  Pending Prescriptions Disp Refills   metoprolol succinate (TOPROL-XL) 50 MG 24 hr tablet [Pharmacy Med Name: METOPROLOL SUCC ER 50 MG TAB] 90 tablet 0    Sig: TAKE 1 TABLET BY MOUTH EVERY DAY     Cardiovascular:  Beta Blockers Passed - 07/02/2022  2:37 AM      Passed - Last BP in normal range    BP Readings from Last 1 Encounters:  01/06/22 128/88         Passed - Last Heart Rate in normal range    Pulse Readings from Last 1 Encounters:  01/06/22 73         Passed - Valid encounter within last 6 months    Recent Outpatient Visits           5 months ago Essential hypertension   [place] at [facility], Jesse Sans, MD   12 months ago Annual physical exam   [facility] at [facility], Jesse Sans, MD   1 year ago Essential hypertension   [place] at [place], Jesse Sans, MD   2 years ago Annual physical exam   [place] at [facility], Jesse Sans, MD   2 years ago Acute cystitis with hematuria   [facility] [place] at [facility], Jesse Sans, MD       Future Appointments             In 5 days Army Melia Jesse Sans, MD [place] at [facility], [facility]

## 2022-07-07 ENCOUNTER — Encounter: Payer: Self-pay | Admitting: Internal Medicine

## 2022-07-07 ENCOUNTER — Ambulatory Visit (INDEPENDENT_AMBULATORY_CARE_PROVIDER_SITE_OTHER): Payer: Medicare HMO

## 2022-07-07 ENCOUNTER — Ambulatory Visit (INDEPENDENT_AMBULATORY_CARE_PROVIDER_SITE_OTHER): Payer: Medicare HMO | Admitting: Internal Medicine

## 2022-07-07 VITALS — Ht 62.0 in | Wt 156.0 lb

## 2022-07-07 VITALS — BP 132/84 | HR 77 | Ht 62.0 in | Wt 156.0 lb

## 2022-07-07 DIAGNOSIS — C44729 Squamous cell carcinoma of skin of left lower limb, including hip: Secondary | ICD-10-CM

## 2022-07-07 DIAGNOSIS — I10 Essential (primary) hypertension: Secondary | ICD-10-CM

## 2022-07-07 DIAGNOSIS — D329 Benign neoplasm of meninges, unspecified: Secondary | ICD-10-CM | POA: Diagnosis not present

## 2022-07-07 DIAGNOSIS — R7989 Other specified abnormal findings of blood chemistry: Secondary | ICD-10-CM | POA: Diagnosis not present

## 2022-07-07 DIAGNOSIS — Z1231 Encounter for screening mammogram for malignant neoplasm of breast: Secondary | ICD-10-CM | POA: Diagnosis not present

## 2022-07-07 DIAGNOSIS — E782 Mixed hyperlipidemia: Secondary | ICD-10-CM

## 2022-07-07 DIAGNOSIS — Z0000 Encounter for general adult medical examination without abnormal findings: Secondary | ICD-10-CM

## 2022-07-07 MED ORDER — LISINOPRIL 40 MG PO TABS: 40.0000 mg | ORAL_TABLET | Freq: Every day | ORAL | 3 refills | Status: DC

## 2022-07-07 MED ORDER — ROSUVASTATIN CALCIUM 40 MG PO TABS: 40.0000 mg | ORAL_TABLET | Freq: Every day | ORAL | 3 refills | Status: DC

## 2022-07-07 MED ORDER — METOPROLOL SUCCINATE ER 50 MG PO TB24: ORAL_TABLET | ORAL | 3 refills | Status: DC

## 2022-07-07 NOTE — Assessment & Plan Note (Signed)
Clinically stable exam with well controlled BP on metoprolol and lisinopril. Tolerating medications without side effects. Pt to continue current regimen and low sodium diet.  

## 2022-07-07 NOTE — Progress Notes (Signed)
Subjective:   Samantha Hodge is a 77 y.o. female who presents for an Initial Medicare Annual Wellness Visit.  I connected with  Samantha Hodge on 07/07/22 by an in person visit and verified that I am speaking with the correct person using two identifiers.  Patient Location: Other:  IN OFFICE  Provider Location: Office/Clinic  I discussed the limitations of evaluation and management by telemedicine. The patient expressed understanding and agreed to proceed.   Review of Systems    Defer to PCP       Objective:    Today's Vitals   07/07/22 0843 07/07/22 0846  Weight: 156 lb (70.8 kg)   Height: 5\' 2"  (1.575 m)   PainSc: 0-No pain 0-No pain   Body mass index is 28.53 kg/m.     07/07/2022    8:35 AM 09/28/2021    9:43 AM 08/01/2020   11:01 AM 02/02/2020    9:27 AM  Advanced Directives  Does Patient Have a Medical Advance Directive? No;Yes Yes Yes No  Type of Advance Directive Living will [place];Living will [place];Living will   Does patient want to make changes to medical advance directive?  No - Patient declined No - Patient declined   Copy of [place] in Chart?  No - copy requested No - copy requested     Current Medications (verified) Outpatient Encounter Medications as of 07/07/2022  Medication Sig   aspirin EC 81 MG tablet Take 81 mg by mouth daily. Swallow whole.   calcium-vitamin D (OSCAL WITH D) 500-200 MG-UNIT tablet Take 1 tablet by mouth.   lisinopril (ZESTRIL) 40 MG tablet TAKE 1 TABLET BY MOUTH EVERY DAY IN THE MORNING   metoprolol succinate (TOPROL-XL) 50 MG 24 hr tablet TAKE 1 TABLET BY MOUTH EVERY DAY   rosuvastatin (CRESTOR) 40 MG tablet TAKE 1 TABLET BY MOUTH EVERY DAY   No facility-administered encounter medications on file as of 07/07/2022.    Allergies (verified) Penicillins and Ezetimibe   History: Past Medical History:  Diagnosis Date   Cancer    melanoma   Hyperlipidemia    Hypertension     Past Surgical History:  Procedure Laterality Date   ABDOMINAL HYSTERECTOMY     ANTERIOR CRUCIATE LIGAMENT REPAIR     Right KNEE   BREAST BIOPSY Left    stereo bx/clip-neg   BREAST CYST ASPIRATION Left    fna- neg   CATARACT EXTRACTION W/PHACO Left 09/28/2021   Procedure: CATARACT EXTRACTION PHACO AND INTRAOCULAR LENS PLACEMENT ([place])  LEFT;  Surgeon: Eulogio Bear, MD;  Location: [facility];  Service: Ophthalmology;  Laterality: Left;  3.26 00:30.5   CATARACT EXTRACTION W/PHACO Right 10/12/2021   Procedure: CATARACT EXTRACTION PHACO AND INTRAOCULAR LENS PLACEMENT ([facility]) RIGHT V[place] Lens 3.88 00:34.1;  Surgeon: Eulogio Bear, MD;  Location: [facility];  Service: Ophthalmology;  Laterality: Right;   COLONOSCOPY WITH PROPOFOL N/A 08/01/2020   Procedure: COLONOSCOPY WITH PROPOFOL;  Surgeon: Lucilla Lame, MD;  Location: [facility];  Service: Endoscopy;  Laterality: N/A;   ECTOPIC PREGNANCY SURGERY     EXCISION OF ATRIAL MYXOMA  2005   KNEE CARTILAGE SURGERY     meniscus removed - left knee   MELANOMA EXCISION     Left Arm   OOPHORECTOMY     SQUAMOUS CELL CARCINOMA EXCISION     x 2- Left Leg, and Right Hand.   Family History  Problem Relation Age of Onset  Breast cancer Mother    Anuerysm Daughter    Heart disease Maternal Grandfather    Social History   Socioeconomic History   Marital status: Married    Spouse name: Not on file   Number of children: Not on file   Years of education: Not on file   Highest education level: Not on file  Occupational History   Not on file  Tobacco Use   Smoking status: Former    Packs/day: 0.08    Years: 20.00    Additional pack years: 0.00    Total pack years: 1.60    Types: Cigarettes    Quit date: 2007    Years since quitting: 17.2   Smokeless tobacco: Never  Vaping Use   Vaping Use: Never used  Substance and Sexual Activity   Alcohol use: Yes    Alcohol/week: 14.0 standard drinks of alcohol     Types: 14 Glasses of wine per week   Drug use: Never   Sexual activity: Yes  Other Topics Concern   Not on file  Social History Narrative   Not on file   Social Determinants of Health   Financial Resource Strain: Low Risk  (07/07/2022)   Overall Financial Resource Strain (CARDIA)    Difficulty of Paying Living Expenses: Not hard at all  Food Insecurity: No Food Insecurity (07/07/2022)   Hunger Vital Sign    Worried About Running Out of Food in the Last Year: Never true    Ran Out of Food in the Last Year: Never true  Transportation Needs: No Transportation Needs (07/07/2022)   PRAPARE - Hydrologist (Medical): No    Lack of Transportation (Non-Medical): No  Physical Activity: Sufficiently Active (07/07/2022)   Exercise Vital Sign    Days of Exercise per Week: 5 days    Minutes of Exercise per Session: 30 min  Stress: No Stress Concern Present (07/07/2022)   [place]    Feeling of Stress : Not at all  Social Connections: Not on file    Tobacco Counseling Counseling given: Not Answered   Clinical Intake:  Pre-visit preparation completed: Yes  Pain : No/denies pain Pain Score: 0-No pain     BMI - recorded: 28.53 Nutritional Status: BMI 25 -29 Overweight Nutritional Risks: None Diabetes: No  How often do you need to have someone help you when you read instructions, pamphlets, or other written materials from your doctor or pharmacy?: 1 - Never  Diabetic? No.  Interpreter Needed?: No  Information entered by :: Wyatt Haste, [place] of Daily Living    07/07/2022    8:35 AM 09/28/2021    9:35 AM  In your present state of health, do you have any difficulty performing the following activities:  Hearing? 0 0  Vision? 0 0  Difficulty concentrating or making decisions? 0 0  Walking or climbing stairs? 0 0  Dressing or bathing? 0 0  Doing errands, shopping? 0    Preparing Food and eating ? N   Using the Toilet? N   In the past six months, have you accidently leaked urine? N   Do you have problems with loss of bowel control? N   Managing your Medications? N   Managing your Finances? N   Housekeeping or managing your Housekeeping? N     Patient Care Team: Glean Hess, MD as PCP - General (Internal Medicine) Rory Percy, MD as  Referring Physician (Dermatology)  Indicate any recent Medical Services you may have received from other than [facility] providers in the past year (date may be approximate).     Assessment:   This is a routine wellness examination for [place].  Hearing/Vision screen No Concerns.  Dietary issues and exercise activities discussed:     Goals Addressed   None   Depression Screen    07/07/2022    8:35 AM 01/06/2022    9:15 AM 07/03/2021    8:36 AM 12/18/2020    8:25 AM 07/01/2020    9:12 AM 05/20/2020    8:00 AM 01/02/2020    3:02 PM  PHQ 2/9 Scores  PHQ - 2 Score 0 0 0 0 0 0 0  PHQ- 9 Score 0 0 0 0 0 0 0    Fall Risk    07/07/2022    8:35 AM 01/06/2022    9:15 AM 07/03/2021    8:37 AM 12/18/2020    8:25 AM 07/01/2020    9:12 AM  Fall Risk   Falls in the past year? 0 0 0 0 0  Number falls in past yr: 0 0 0 0   Injury with Fall? 0 0 0 0   Risk for fall due to : No Fall Risks No Fall Risks No Fall Risks No Fall Risks   Follow up Falls evaluation completed Falls evaluation completed Falls evaluation completed Falls evaluation completed Falls evaluation completed    [place]:  Any stairs in or around the home? No  If so, are there any without handrails?  N/A Home free of loose throw rugs in walkways, pet beds, electrical cords, etc? Yes  Adequate lighting in your home to reduce risk of falls? Yes   ASSISTIVE DEVICES UTILIZED TO PREVENT FALLS:  Life alert? No  Use of a cane, walker or w/c? No   TIMED UP AND GO:  Was the test performed? Yes .  Gait steady and fast  without use of assistive device  Cognitive Function:        07/07/2022    8:36 AM  6CIT Screen  What Year? 0 points  What month? 0 points  What time? 0 points  Count back from 20 0 points  Months in reverse 0 points  Repeat phrase 4 points  Total Score 4 points    Immunizations Immunization History  Administered Date(s) Administered   Fluad Quad(high Dose 65+) 01/28/2020, 12/18/2020, 01/06/2022   PFIZER(Purple Top)SARS-COV-2 Vaccination 05/03/2019, 05/24/2019, 03/24/2020   Pneumococcal Conjugate-13 04/05/2017   Pneumococcal Polysaccharide-23 04/05/2018   Tdap 03/20/2019   Tetanus 03/14/2008    TDAP status: Up to date  Flu Vaccine status: Up to date  Pneumococcal vaccine status: Up to date  Covid-19 vaccine status: Completed vaccines  Qualifies for Shingles Vaccine? Yes   Zostavax completed No   Shingrix Completed?: No.    Education has been provided regarding the importance of this vaccine. Patient has been advised to call insurance company to determine out of pocket expense if they have not yet received this vaccine. Advised may also receive vaccine at local pharmacy or Health Dept. Verbalized acceptance and understanding.  Screening Tests Health Maintenance  Topic Date Due   Zoster Vaccines- Shingrix (1 of 2) Never done   COVID-19 Vaccine (4 - 2023-24 season) 12/04/2021   DEXA SCAN  01/07/2023 (Originally 12/25/2009)   INFLUENZA VACCINE  11/04/2022   Medicare Annual Wellness (AWV)  07/07/2023   DTaP/Tdap/Td (2 -  Td or Tdap) 03/19/2029   Pneumonia Vaccine 60+ Years old  Completed   Hepatitis C Screening  Completed   HPV VACCINES  Aged Out   COLONOSCOPY (Pts 45-60yrs Insurance coverage will need to be confirmed)  Discontinued    Health Maintenance  Health Maintenance Due  Topic Date Due   Zoster Vaccines- Shingrix (1 of 2) Never done   COVID-19 Vaccine (4 - 2023-24 season) 12/04/2021    Colorectal cancer screening: Type of screening: Colonoscopy. Completed  08/01/2020. Repeat every 10 years  Mammogram status: No longer required due to age.   Lung Cancer Screening: (Low Dose CT Chest recommended if Age 74-80 years, 30 pack-year currently smoking OR have quit w/in 15years.) does not qualify.   Additional Screening:  Hepatitis C Screening: does qualify; Completed 07/01/2020  Vision Screening: Recommended annual ophthalmology exams for early detection of glaucoma and other disorders of the eye. Is the patient up to date with their annual eye exam?  Yes  Who is the provider or what is the name of the office in which the patient attends annual eye exams? [facility]  Dental Screening: Recommended annual dental exams for proper oral hygiene  Community Resource Referral / Chronic Care Management: CRR required this visit?  No   CCM required this visit?  No.     Plan:     I have personally reviewed and noted the following in the patient's chart:   Medical and social history Use of alcohol, tobacco or illicit drugs  Current medications and supplements including opioid prescriptions. Patient is not currently taking opioid prescriptions. Functional ability and status Nutritional status Physical activity Advanced directives List of other physicians Hospitalizations, surgeries, and ER visits in previous 12 months Vitals Screenings to include cognitive, depression, and falls Referrals and appointments  In addition, I have reviewed and discussed with patient certain preventive protocols, quality metrics, and best practice recommendations. A written personalized care plan for preventive services as well as general preventive health recommendations were provided to patient.     Clista Bernhardt, [place]   07/07/2022   Nurse Notes: None.

## 2022-07-07 NOTE — Assessment & Plan Note (Signed)
Tolerating statin medications without concerns LDL is  Lab Results  Component Value Date   LDLCALC 115 (H) 07/03/2021  On Crestor 40 mg - with a goal of < 70. Current dose will be adjusted if needed.

## 2022-07-07 NOTE — Assessment & Plan Note (Signed)
With normal T4 Will continue to monitor for worsening

## 2022-07-07 NOTE — Progress Notes (Signed)
Date:  07/07/2022   Name:  Samantha Hodge   DOB:  [birth year]   MRN:  [id number]   Chief Complaint: Annual Exam Samantha Hodge is a 77 y.o. female who presents today for her Complete Annual Exam. She feels well. She reports exercising / walking. She reports she is sleeping well. Breast complaints - none.  Mammogram: 02/2022 DEXA: remote - normal Colonoscopy: 07/2020 - aged out     07/07/2022    8:36 AM  6CIT Screen  What Year? 0 points  What month? 0 points  What time? 0 points  Count back from 20 0 points  Months in reverse 0 points  Repeat phrase 4 points  Total Score 4 points      Health Maintenance Due  Topic Date Due   Zoster Vaccines- Shingrix (1 of 2) Never done   COVID-19 Vaccine (4 - 2023-24 season) 12/04/2021    Immunization History  Administered Date(s) Administered   Fluad Quad(high Dose 65+) 01/28/2020, 12/18/2020, 01/06/2022   PFIZER(Purple Top)SARS-COV-2 Vaccination 05/03/2019, 05/24/2019, 03/24/2020   Pneumococcal Conjugate-13 04/05/2017   Pneumococcal Polysaccharide-23 04/05/2018   Tdap 03/20/2019   Tetanus 03/14/2008    Hypertension This is a chronic problem. The problem is controlled. Pertinent negatives include no chest pain, headaches, palpitations or shortness of breath. Past treatments include beta blockers and ACE inhibitors. The current treatment provides significant improvement. There is no history of kidney disease, CAD/MI or CVA.  Hyperlipidemia This is a chronic problem. The problem is uncontrolled. Recent lipid tests were reviewed and are high. Pertinent negatives include no chest pain or shortness of breath. Current antihyperlipidemic treatment includes statins. The current treatment provides significant improvement of lipids.    Lab Results  Component Value Date   NA 140 07/03/2021   K 4.8 07/03/2021   CO2 24 07/03/2021   GLUCOSE 99 07/03/2021   BUN 16 07/03/2021   CREATININE 0.81 07/03/2021   CALCIUM 10.2 07/03/2021   EGFR 75 07/03/2021    GFRNONAA 87 01/02/2020   Lab Results  Component Value Date   CHOL 205 (H) 07/03/2021   HDL 65 07/03/2021   LDLCALC 115 (H) 07/03/2021   TRIG 145 07/03/2021   CHOLHDL 3.2 07/03/2021   Lab Results  Component Value Date   TSH 8.680 (H) 07/03/2021   No results found for: "HGBA1C" Lab Results  Component Value Date   WBC 5.6 07/03/2021   HGB 14.5 07/03/2021   HCT 44.0 07/03/2021   MCV 87 07/03/2021   PLT 271 07/03/2021   Lab Results  Component Value Date   ALT 27 07/03/2021   AST 26 07/03/2021   ALKPHOS 62 07/03/2021   BILITOT 0.7 07/03/2021   No results found for: "25OHVITD2", "25OHVITD3", "VD25OH"   Review of Systems  Constitutional:  Negative for chills, fatigue and fever.  HENT:  Negative for congestion, hearing loss, tinnitus, trouble swallowing and voice change.   Eyes:  Negative for visual disturbance.  Respiratory:  Negative for cough, chest tightness, shortness of breath and wheezing.   Cardiovascular:  Negative for chest pain, palpitations and leg swelling.  Gastrointestinal:  Negative for abdominal pain, constipation, diarrhea and vomiting.  Endocrine: Negative for polydipsia and polyuria.  Genitourinary:  Negative for dysuria, frequency, genital sores, vaginal bleeding and vaginal discharge.  Musculoskeletal:  Negative for arthralgias, gait problem and joint swelling.  Skin:  Negative for color change and rash.  Neurological:  Negative for dizziness, tremors, light-headedness and headaches.  Hematological:  Negative for adenopathy. Does not bruise/bleed  easily.  Psychiatric/Behavioral:  Negative for dysphoric mood and sleep disturbance. The patient is not nervous/anxious.     Patient Active Problem List   Diagnosis Date Noted   Elevated TSH 01/06/2022   Squamous cell carcinoma of leg, left 01/06/2022   Ocular migraine 07/01/2020   Meningioma 05/20/2020   Essential hypertension 01/02/2020   Mixed hyperlipidemia 01/02/2020   Hx of melanoma of skin  01/02/2020   History of myxoma 07/20/2005    Allergies  Allergen Reactions   Penicillins Hives   Ezetimibe     Past Surgical History:  Procedure Laterality Date   ABDOMINAL HYSTERECTOMY     ANTERIOR CRUCIATE LIGAMENT REPAIR     Right KNEE   BREAST BIOPSY Left    stereo bx/clip-neg   BREAST CYST ASPIRATION Left    fna- neg   CATARACT EXTRACTION W/PHACO Left 09/28/2021   Procedure: CATARACT EXTRACTION PHACO AND INTRAOCULAR LENS PLACEMENT ([place])  LEFT;  Surgeon: Eulogio Bear, MD;  Location: [facility];  Service: Ophthalmology;  Laterality: Left;  3.26 00:30.5   CATARACT EXTRACTION W/PHACO Right 10/12/2021   Procedure: CATARACT EXTRACTION PHACO AND INTRAOCULAR LENS PLACEMENT ([facility]) RIGHT V[place] Lens 3.88 00:34.1;  Surgeon: Eulogio Bear, MD;  Location: [facility];  Service: Ophthalmology;  Laterality: Right;   COLONOSCOPY WITH PROPOFOL N/A 08/01/2020   Procedure: COLONOSCOPY WITH PROPOFOL;  Surgeon: Lucilla Lame, MD;  Location: [facility];  Service: Endoscopy;  Laterality: N/A;   ECTOPIC PREGNANCY SURGERY     EXCISION OF ATRIAL MYXOMA  2005   KNEE CARTILAGE SURGERY     meniscus removed - left knee   MELANOMA EXCISION     Left Arm   OOPHORECTOMY     SQUAMOUS CELL CARCINOMA EXCISION     x 2- Left Leg, and Right Hand.    Social History   Tobacco Use   Smoking status: Former    Packs/day: 0.08    Years: 20.00    Additional pack years: 0.00    Total pack years: 1.60    Types: Cigarettes    Quit date: 2007    Years since quitting: 17.2   Smokeless tobacco: Never  Vaping Use   Vaping Use: Never used  Substance Use Topics   Alcohol use: Yes    Alcohol/week: 14.0 standard drinks of alcohol    Types: 14 Glasses of wine per week   Drug use: Never     Medication list has been reviewed and updated.  Current Meds  Medication Sig   aspirin EC 81 MG tablet Take 81 mg by mouth daily. Swallow whole.   calcium-vitamin D (OSCAL WITH D)  500-200 MG-UNIT tablet Take 1 tablet by mouth.   [DISCONTINUED] lisinopril (ZESTRIL) 40 MG tablet TAKE 1 TABLET BY MOUTH EVERY DAY IN THE MORNING   [DISCONTINUED] metoprolol succinate (TOPROL-XL) 50 MG 24 hr tablet TAKE 1 TABLET BY MOUTH EVERY DAY   [DISCONTINUED] rosuvastatin (CRESTOR) 40 MG tablet TAKE 1 TABLET BY MOUTH EVERY DAY       01/06/2022    9:16 AM 07/03/2021    8:36 AM 12/18/2020    8:25 AM 07/01/2020    9:13 AM  GAD 7 : Generalized Anxiety Score  Nervous, Anxious, on Edge 0 0 0 0  Control/stop worrying 0 0 0 0  Worry too much - different things 0 0 0 0  Trouble relaxing 0 0 0 0  Restless 0 0 0 0  Easily annoyed or irritable 0 0 0 0  Afraid - awful might happen 0 0 0 0  Total GAD 7 Score 0 0 0 0  Anxiety Difficulty Not difficult at all Not difficult at all         07/07/2022    8:35 AM 01/06/2022    9:15 AM 07/03/2021    8:36 AM  Depression screen PHQ 2/9  Decreased Interest 0 0 0  Down, Depressed, Hopeless 0 0 0  PHQ - 2 Score 0 0 0  Altered sleeping 0 0 0  Tired, decreased energy 0 0 0  Change in appetite 0 0 0  Feeling bad or failure about yourself  0 0 0  Trouble concentrating 0 0 0  Moving slowly or fidgety/restless 0 0 0  Suicidal thoughts 0 0 0  PHQ-9 Score 0 0 0  Difficult doing work/chores Not difficult at all Not difficult at all Not difficult at all    BP Readings from Last 3 Encounters:  07/07/22 132/84  01/06/22 128/88  10/12/21 135/72    Physical Exam Vitals and nursing note reviewed.  Constitutional:      General: She is not in acute distress.    Appearance: She is well-developed.  HENT:     Head: Normocephalic and atraumatic.     Right Ear: Tympanic membrane and ear canal normal.     Left Ear: Tympanic membrane and ear canal normal.     Nose:     Right Sinus: No maxillary sinus tenderness.     Left Sinus: No maxillary sinus tenderness.  Eyes:     General: No scleral icterus.       Right eye: No discharge.        Left eye: No  discharge.     Conjunctiva/sclera: Conjunctivae normal.  Neck:     Thyroid: No thyromegaly.     Vascular: No carotid bruit.  Cardiovascular:     Rate and Rhythm: Normal rate and regular rhythm.     Pulses: Normal pulses.     Heart sounds: Normal heart sounds.  Pulmonary:     Effort: Pulmonary effort is normal. No respiratory distress.     Breath sounds: No wheezing.  Chest:  Breasts:    Right: No mass, nipple discharge, skin change or tenderness.     Left: No mass, nipple discharge, skin change or tenderness.  Abdominal:     General: Bowel sounds are normal.     Palpations: Abdomen is soft.     Tenderness: There is no abdominal tenderness.  Musculoskeletal:     Cervical back: Normal range of motion. No erythema.     Right lower leg: No edema.     Left lower leg: No edema.  Lymphadenopathy:     Cervical: No cervical adenopathy.  Skin:    General: Skin is warm and dry.     Findings: No rash.  Neurological:     Mental Status: She is alert and oriented to person, place, and time.     Cranial Nerves: No cranial nerve deficit.     Sensory: No sensory deficit.     Deep Tendon Reflexes: Reflexes are normal and symmetric.  Psychiatric:        Attention and Perception: Attention normal.        Mood and Affect: Mood normal.     Wt Readings from Last 3 Encounters:  07/07/22 156 lb (70.8 kg)  07/07/22 156 lb (70.8 kg)  01/06/22 161 lb (73 kg)    BP 132/84   Pulse 77  Ht 5\' 2"  (1.575 m)   Wt 156 lb (70.8 kg)   SpO2 98%   BMI 28.53 kg/m   Assessment and Plan:  Problem List Items Addressed This Visit       Cardiovascular and Mediastinum   Essential hypertension (Chronic)    Clinically stable exam with well controlled BP on metoprolol and lisinopril. Tolerating medications without side effects. Pt to continue current regimen and low sodium diet.       Relevant Medications   lisinopril (ZESTRIL) 40 MG tablet   metoprolol succinate (TOPROL-XL) 50 MG 24 hr tablet    rosuvastatin (CRESTOR) 40 MG tablet   Other Relevant Orders   CBC with Differential/Platelet   Comprehensive metabolic panel   Urinalysis, Routine w reflex microscopic     Nervous and Auditory   Meningioma (Chronic)    Seen in 2022 - no symptoms         Musculoskeletal and Integument   Squamous cell carcinoma of leg, left     Other   Elevated TSH    With normal T4 Will continue to monitor for worsening      Relevant Orders   TSH + free T4   Mixed hyperlipidemia (Chronic)    Tolerating statin medications without concerns LDL is  Lab Results  Component Value Date   LDLCALC 115 (H) 07/03/2021  On Crestor 40 mg - with a goal of < 70. Current dose will be adjusted if needed.       Relevant Medications   lisinopril (ZESTRIL) 40 MG tablet   metoprolol succinate (TOPROL-XL) 50 MG 24 hr tablet   rosuvastatin (CRESTOR) 40 MG tablet   Other Relevant Orders   Lipid panel   Other Visit Diagnoses     Annual physical exam    -  Primary   Normal exam up to date on immunizations/screenings   Encounter for screening mammogram for breast cancer       due in November       Return in about 6 months (around 01/06/2023) for HTN.   Partially dictated using [place], any errors are not intentional.  Glean Hess, MD [employer], [place]

## 2022-07-07 NOTE — Assessment & Plan Note (Signed)
Seen in 2022 - no symptoms

## 2022-07-08 ENCOUNTER — Telehealth: Payer: Self-pay | Admitting: *Deleted

## 2022-07-08 LAB — CBC WITH DIFFERENTIAL/PLATELET
Basophils Absolute: 0 10*3/uL (ref 0.0–0.2)
Basos: 0 %
EOS (ABSOLUTE): 0.1 10*3/uL (ref 0.0–0.4)
Eos: 1 %
Hematocrit: 43.8 % (ref 34.0–46.6)
Hemoglobin: 14.3 g/dL (ref 11.1–15.9)
Immature Grans (Abs): 0 10*3/uL (ref 0.0–0.1)
Immature Granulocytes: 0 %
Lymphocytes Absolute: 2.2 10*3/uL (ref 0.7–3.1)
Lymphs: 32 %
MCH: 29.2 pg (ref 26.6–33.0)
MCHC: 32.6 g/dL (ref 31.5–35.7)
MCV: 89 fL (ref 79–97)
Monocytes Absolute: 0.7 10*3/uL (ref 0.1–0.9)
Monocytes: 10 %
Neutrophils Absolute: 4 10*3/uL (ref 1.4–7.0)
Neutrophils: 57 %
Platelets: 316 10*3/uL (ref 150–450)
RBC: 4.9 x10E6/uL (ref 3.77–5.28)
RDW: 12.5 % (ref 11.7–15.4)
WBC: 7.1 10*3/uL (ref 3.4–10.8)

## 2022-07-08 LAB — COMPREHENSIVE METABOLIC PANEL
ALT: 26 IU/L (ref 0–32)
AST: 23 IU/L (ref 0–40)
Albumin/Globulin Ratio: 2.1 (ref 1.2–2.2)
Albumin: 4.6 g/dL (ref 3.8–4.8)
Alkaline Phosphatase: 64 IU/L (ref 44–121)
BUN/Creatinine Ratio: 27 (ref 12–28)
BUN: 19 mg/dL (ref 8–27)
Bilirubin Total: 0.4 mg/dL (ref 0.0–1.2)
CO2: 22 mmol/L (ref 20–29)
Calcium: 10.2 mg/dL (ref 8.7–10.3)
Chloride: 99 mmol/L (ref 96–106)
Creatinine, Ser: 0.71 mg/dL (ref 0.57–1.00)
Globulin, Total: 2.2 g/dL (ref 1.5–4.5)
Glucose: 104 mg/dL — ABNORMAL HIGH (ref 70–99)
Potassium: 5 mmol/L (ref 3.5–5.2)
Sodium: 137 mmol/L (ref 134–144)
Total Protein: 6.8 g/dL (ref 6.0–8.5)
eGFR: 88 mL/min/{1.73_m2} (ref >59)

## 2022-07-08 LAB — URINALYSIS, ROUTINE W REFLEX MICROSCOPIC
Bilirubin, UA: NEGATIVE
Glucose, UA: NEGATIVE
Ketones, UA: NEGATIVE
Nitrite, UA: NEGATIVE
Protein,UA: NEGATIVE
RBC, UA: NEGATIVE
Specific Gravity, UA: 1.015 (ref 1.005–1.030)
Urobilinogen, Ur: 0.2 mg/dL (ref 0.2–1.0)
pH, UA: 6 (ref 5.0–7.5)

## 2022-07-08 LAB — MICROSCOPIC EXAMINATION
Bacteria, UA: NONE SEEN
Casts: NONE SEEN /lpf

## 2022-07-08 LAB — LIPID PANEL
Chol/HDL Ratio: 2.6 ratio (ref 0.0–4.4)
Cholesterol, Total: 222 mg/dL — ABNORMAL HIGH (ref 100–199)
HDL: 86 mg/dL (ref >39)
LDL Chol Calc (NIH): 122 mg/dL — ABNORMAL HIGH (ref 0–99)
Triglycerides: 82 mg/dL (ref 0–149)
VLDL Cholesterol Cal: 14 mg/dL (ref 5–40)

## 2022-07-08 LAB — TSH+FREE T4
Free T4: 1.13 ng/dL (ref 0.82–1.77)
TSH: 3.45 u[IU]/mL (ref 0.450–4.500)

## 2022-07-08 NOTE — Progress Notes (Signed)
PC to pt LVM PEC may give results CRM created

## 2022-07-08 NOTE — Telephone Encounter (Signed)
Pt given lab results per notes of Dr. Army Melia on 07/08/22. Pt verbalized understanding labs are good , urine has some white cells but no bacteria, continue same medications.

## 2022-07-09 NOTE — Progress Notes (Signed)
PC to pt, states she got her results yesterday when she called back. Asked pt if she had any questions or concerns. Pt voiced understanding

## 2022-08-06 ENCOUNTER — Other Ambulatory Visit: Payer: Self-pay | Admitting: Internal Medicine

## 2022-08-06 DIAGNOSIS — Z1231 Encounter for screening mammogram for malignant neoplasm of breast: Secondary | ICD-10-CM

## 2022-12-14 DIAGNOSIS — L986 Other infiltrative disorders of the skin and subcutaneous tissue: Secondary | ICD-10-CM | POA: Diagnosis not present

## 2022-12-14 DIAGNOSIS — L821 Other seborrheic keratosis: Secondary | ICD-10-CM | POA: Diagnosis not present

## 2022-12-14 DIAGNOSIS — L578 Other skin changes due to chronic exposure to nonionizing radiation: Secondary | ICD-10-CM | POA: Diagnosis not present

## 2022-12-14 DIAGNOSIS — R898 Other abnormal findings in specimens from other organs, systems and tissues: Secondary | ICD-10-CM | POA: Diagnosis not present

## 2022-12-14 DIAGNOSIS — Z85820 Personal history of malignant melanoma of skin: Secondary | ICD-10-CM | POA: Diagnosis not present

## 2022-12-14 DIAGNOSIS — D479 Neoplasm of uncertain behavior of lymphoid, hematopoietic and related tissue, unspecified: Secondary | ICD-10-CM | POA: Diagnosis not present

## 2022-12-14 DIAGNOSIS — L570 Actinic keratosis: Secondary | ICD-10-CM | POA: Diagnosis not present

## 2022-12-14 DIAGNOSIS — Z872 Personal history of diseases of the skin and subcutaneous tissue: Secondary | ICD-10-CM | POA: Diagnosis not present

## 2022-12-14 DIAGNOSIS — Z859 Personal history of malignant neoplasm, unspecified: Secondary | ICD-10-CM | POA: Diagnosis not present

## 2022-12-14 DIAGNOSIS — D485 Neoplasm of uncertain behavior of skin: Secondary | ICD-10-CM | POA: Diagnosis not present

## 2023-01-06 ENCOUNTER — Ambulatory Visit: Payer: Medicare HMO | Admitting: Internal Medicine

## 2023-01-24 ENCOUNTER — Encounter: Payer: Self-pay | Admitting: Internal Medicine

## 2023-01-24 ENCOUNTER — Ambulatory Visit (INDEPENDENT_AMBULATORY_CARE_PROVIDER_SITE_OTHER): Payer: Medicare HMO | Admitting: Internal Medicine

## 2023-01-24 VITALS — BP 122/79 | HR 71 | Ht 62.0 in | Wt 154.0 lb

## 2023-01-24 DIAGNOSIS — Z85820 Personal history of malignant melanoma of skin: Secondary | ICD-10-CM

## 2023-01-24 DIAGNOSIS — I10 Essential (primary) hypertension: Secondary | ICD-10-CM

## 2023-01-24 DIAGNOSIS — E782 Mixed hyperlipidemia: Secondary | ICD-10-CM

## 2023-01-24 DIAGNOSIS — Z23 Encounter for immunization: Secondary | ICD-10-CM

## 2023-01-24 NOTE — Assessment & Plan Note (Signed)
LDL is  Lab Results  Component Value Date   LDLCALC 122 (H) 07/07/2022   Currently being treated with Crestor with good compliance and no concerns.

## 2023-01-24 NOTE — Assessment & Plan Note (Signed)
And recent SCCA of the hand Followed closely by Dermatology

## 2023-01-24 NOTE — Progress Notes (Signed)
Date:  01/24/2023   Name:  Colena Scheele   DOB:  March 31, 1946   MRN:  [id number]   Chief Complaint: Hypertension  Hypertension This is a chronic problem. The problem is controlled. Pertinent negatives include no chest pain, headaches, palpitations or shortness of breath. Past treatments include ACE inhibitors and beta blockers. The current treatment provides significant improvement. There is no history of kidney disease, CAD/MI or CVA.  Hyperlipidemia This is a chronic problem. The problem is controlled. Pertinent negatives include no chest pain or shortness of breath. Current antihyperlipidemic treatment includes statins. The current treatment provides significant improvement of lipids.    Review of Systems  Constitutional:  Negative for fatigue and unexpected weight change.  HENT:  Negative for nosebleeds.   Eyes:  Negative for visual disturbance.  Respiratory:  Negative for cough, chest tightness, shortness of breath and wheezing.   Cardiovascular:  Negative for chest pain, palpitations and leg swelling.  Gastrointestinal:  Negative for abdominal pain, constipation and diarrhea.  Neurological:  Negative for dizziness, weakness, light-headedness and headaches.     Lab Results  Component Value Date   NA 137 07/07/2022   K 5.0 07/07/2022   CO2 22 07/07/2022   GLUCOSE 104 (H) 07/07/2022   BUN 19 07/07/2022   CREATININE 0.71 07/07/2022   CALCIUM 10.2 07/07/2022   EGFR 88 07/07/2022   GFRNONAA 87 01/02/2020   Lab Results  Component Value Date   CHOL 222 (H) 07/07/2022   HDL 86 07/07/2022   LDLCALC 122 (H) 07/07/2022   TRIG 82 07/07/2022   CHOLHDL 2.6 07/07/2022   Lab Results  Component Value Date   TSH 3.450 07/07/2022   No results found for: "HGBA1C" Lab Results  Component Value Date   WBC 7.1 07/07/2022   HGB 14.3 07/07/2022   HCT 43.8 07/07/2022   MCV 89 07/07/2022   PLT 316 07/07/2022   Lab Results  Component Value Date   ALT 26 07/07/2022   AST 23  07/07/2022   ALKPHOS 64 07/07/2022   BILITOT 0.4 07/07/2022   No results found for: "25OHVITD2", "25OHVITD3", "VD25OH"   Patient Active Problem List   Diagnosis Date Noted   Elevated TSH 01/06/2022   Squamous cell carcinoma of leg, left 01/06/2022   Ocular migraine 07/01/2020   Meningioma (HCC) 05/20/2020   Essential hypertension 01/02/2020   Mixed hyperlipidemia 01/02/2020   Hx of melanoma of skin 01/02/2020   History of myxoma 07/20/2005    Allergies  Allergen Reactions   Penicillins Hives   Ezetimibe     Past Surgical History:  Procedure Laterality Date   ABDOMINAL HYSTERECTOMY     ANTERIOR CRUCIATE LIGAMENT REPAIR     Right KNEE   BREAST BIOPSY Left    stereo bx/clip-neg   BREAST CYST ASPIRATION Left    fna- neg   CATARACT EXTRACTION W/PHACO Left 09/28/2021   Procedure: CATARACT EXTRACTION PHACO AND INTRAOCULAR LENS PLACEMENT (IOC)  LEFT;  Surgeon: Nevada Crane, MD;  Location: [facility];  Service: Ophthalmology;  Laterality: Left;  3.26 00:30.5   CATARACT EXTRACTION W/PHACO Right 10/12/2021   Procedure: CATARACT EXTRACTION PHACO AND INTRAOCULAR LENS PLACEMENT (IOC) RIGHT Vivity Lens 3.88 00:34.1;  Surgeon: Nevada Crane, MD;  Location: [facility];  Service: Ophthalmology;  Laterality: Right;   COLONOSCOPY WITH PROPOFOL N/A 08/01/2020   Procedure: COLONOSCOPY WITH PROPOFOL;  Surgeon: Midge Minium, MD;  Location: [facility];  Service: Endoscopy;  Laterality: N/A;   ECTOPIC PREGNANCY SURGERY  EXCISION OF ATRIAL MYXOMA  2005   KNEE CARTILAGE SURGERY     meniscus removed - left knee   MELANOMA EXCISION     Left Arm   OOPHORECTOMY     SQUAMOUS CELL CARCINOMA EXCISION     x 2- Left Leg, and Right Hand.    Social History   Tobacco Use   Smoking status: Former    Current packs/day: 0.00    Average packs/day: 0.1 packs/day for 20.0 years (1.6 ttl pk-yrs)    Types: Cigarettes    Start date: 93    Quit date: 2007    Years  since quitting: 17.8   Smokeless tobacco: Never  Vaping Use   Vaping status: Never Used  Substance Use Topics   Alcohol use: Yes    Alcohol/week: 14.0 standard drinks of alcohol    Types: 14 Glasses of wine per week   Drug use: Never     Medication list has been reviewed and updated.  Current Meds  Medication Sig   aspirin EC 81 MG tablet Take 81 mg by mouth daily. Swallow whole.   calcium-vitamin D (OSCAL WITH D) 500-200 MG-UNIT tablet Take 1 tablet by mouth.   lisinopril (ZESTRIL) 40 MG tablet Take 1 tablet (40 mg total) by mouth daily.   metoprolol succinate (TOPROL-XL) 50 MG 24 hr tablet TAKE 1 TABLET BY MOUTH EVERY DAY   rosuvastatin (CRESTOR) 40 MG tablet Take 1 tablet (40 mg total) by mouth daily.       01/24/2023    2:27 PM 01/06/2022    9:16 AM 07/03/2021    8:36 AM 12/18/2020    8:25 AM  GAD 7 : Generalized Anxiety Score  Nervous, Anxious, on Edge 0 0 0 0  Control/stop worrying 0 0 0 0  Worry too much - different things 0 0 0 0  Trouble relaxing 0 0 0 0  Restless 0 0 0 0  Easily annoyed or irritable 0 0 0 0  Afraid - awful might happen 0 0 0 0  Total GAD 7 Score 0 0 0 0  Anxiety Difficulty Not difficult at all Not difficult at all Not difficult at all        01/24/2023    2:27 PM 07/07/2022    8:35 AM 01/06/2022    9:15 AM  Depression screen PHQ 2/9  Decreased Interest 0 0 0  Down, Depressed, Hopeless 0 0 0  PHQ - 2 Score 0 0 0  Altered sleeping 0 0 0  Tired, decreased energy 0 0 0  Change in appetite 0 0 0  Feeling bad or failure about yourself  0 0 0  Trouble concentrating 0 0 0  Moving slowly or fidgety/restless 0 0 0  Suicidal thoughts 0 0 0  PHQ-9 Score 0 0 0  Difficult doing work/chores Not difficult at all Not difficult at all Not difficult at all    BP Readings from Last 3 Encounters:  01/24/23 122/79  07/07/22 132/84  01/06/22 128/88    Physical Exam Vitals and nursing note reviewed.  Constitutional:      General: She is not in acute  distress.    Appearance: Normal appearance. She is well-developed.  HENT:     Head: Normocephalic and atraumatic.  Neck:     Vascular: No carotid bruit.  Cardiovascular:     Rate and Rhythm: Normal rate and regular rhythm.  Pulmonary:     Effort: Pulmonary effort is normal. No respiratory distress.  Breath sounds: No wheezing or rhonchi.  Musculoskeletal:     Cervical back: Normal range of motion.     Right lower leg: No edema.     Left lower leg: No edema.  Lymphadenopathy:     Cervical: No cervical adenopathy.  Skin:    General: Skin is warm and dry.     Findings: No rash.  Neurological:     General: No focal deficit present.     Mental Status: She is alert and oriented to person, place, and time.  Psychiatric:        Mood and Affect: Mood normal.        Behavior: Behavior normal.     Wt Readings from Last 3 Encounters:  01/24/23 154 lb (69.9 kg)  07/07/22 156 lb (70.8 kg)  07/07/22 156 lb (70.8 kg)    BP 122/79 (BP Location: Right Arm, Cuff Size: Normal)   Pulse 71   Ht 5\' 2"  (1.575 m)   Wt 154 lb (69.9 kg)   SpO2 94%   BMI 28.17 kg/m   Assessment and Plan:  Problem List Items Addressed This Visit       Unprioritized   Essential hypertension - Primary (Chronic)    Normal exam with stable BP on lisinopril and metoprolol. No concerns or side effects to current medication. No change in regimen; continue low sodium diet.        No follow-ups on file.    Reubin Milan, MD [facility] [place]

## 2023-01-24 NOTE — Assessment & Plan Note (Signed)
 Normal exam with stable BP on lisinopril and metoprolol. No concerns or side effects to current medication. No change in regimen; continue low sodium diet.

## 2023-01-25 DIAGNOSIS — L986 Other infiltrative disorders of the skin and subcutaneous tissue: Secondary | ICD-10-CM | POA: Diagnosis not present

## 2023-01-25 DIAGNOSIS — D47Z9 Other specified neoplasms of uncertain behavior of lymphoid, hematopoietic and related tissue: Secondary | ICD-10-CM | POA: Diagnosis not present

## 2023-02-10 DIAGNOSIS — L570 Actinic keratosis: Secondary | ICD-10-CM | POA: Diagnosis not present

## 2023-02-17 ENCOUNTER — Ambulatory Visit
Admission: RE | Admit: 2023-02-17 | Discharge: 2023-02-17 | Disposition: A | Discharge status: Home / Self Care | Payer: Medicare HMO | Source: Ambulatory Visit | Attending: Internal Medicine | Admitting: Internal Medicine

## 2023-02-17 DIAGNOSIS — Z1231 Encounter for screening mammogram for malignant neoplasm of breast: Secondary | ICD-10-CM | POA: Insufficient documentation

## 2023-04-28 DIAGNOSIS — R898 Other abnormal findings in specimens from other organs, systems and tissues: Secondary | ICD-10-CM | POA: Diagnosis not present

## 2023-04-28 DIAGNOSIS — L0390 Cellulitis, unspecified: Secondary | ICD-10-CM | POA: Diagnosis not present

## 2023-04-28 DIAGNOSIS — C84A0 Cutaneous T-cell lymphoma, unspecified, unspecified site: Secondary | ICD-10-CM | POA: Diagnosis not present

## 2023-07-11 ENCOUNTER — Encounter: Payer: Self-pay | Admitting: Internal Medicine

## 2023-07-11 ENCOUNTER — Ambulatory Visit (INDEPENDENT_AMBULATORY_CARE_PROVIDER_SITE_OTHER): Payer: Self-pay | Admitting: Internal Medicine

## 2023-07-11 VITALS — BP 144/86 | HR 80 | Ht 62.0 in | Wt 155.4 lb

## 2023-07-11 DIAGNOSIS — I10 Essential (primary) hypertension: Secondary | ICD-10-CM | POA: Diagnosis not present

## 2023-07-11 DIAGNOSIS — R739 Hyperglycemia, unspecified: Secondary | ICD-10-CM | POA: Diagnosis not present

## 2023-07-11 DIAGNOSIS — Z13820 Encounter for screening for osteoporosis: Secondary | ICD-10-CM | POA: Diagnosis not present

## 2023-07-11 DIAGNOSIS — E782 Mixed hyperlipidemia: Secondary | ICD-10-CM

## 2023-07-11 DIAGNOSIS — Z1231 Encounter for screening mammogram for malignant neoplasm of breast: Secondary | ICD-10-CM | POA: Diagnosis not present

## 2023-07-11 DIAGNOSIS — R7989 Other specified abnormal findings of blood chemistry: Secondary | ICD-10-CM

## 2023-07-11 DIAGNOSIS — Z0000 Encounter for general adult medical examination without abnormal findings: Secondary | ICD-10-CM | POA: Diagnosis not present

## 2023-07-11 MED ORDER — METOPROLOL SUCCINATE ER 50 MG PO TB24: ORAL_TABLET | ORAL | 3 refills | Status: AC

## 2023-07-11 MED ORDER — ROSUVASTATIN CALCIUM 40 MG PO TABS: 40.0000 mg | ORAL_TABLET | Freq: Every day | ORAL | 3 refills | Status: AC

## 2023-07-11 MED ORDER — LISINOPRIL 40 MG PO TABS: 40.0000 mg | ORAL_TABLET | Freq: Every day | ORAL | 3 refills | Status: AC

## 2023-07-11 NOTE — Patient Instructions (Addendum)
 Call [facility] to schedule your mammogram at [phone].  Monitor BP at home several times per week and call me if it remains over 130/80.

## 2023-07-11 NOTE — Assessment & Plan Note (Signed)
 T4 has been normal Will repeat labs

## 2023-07-11 NOTE — Assessment & Plan Note (Addendum)
 Blood pressure is elevated today. Current medications lisinopril and metoprolol. She will monitor at home - call if persistently elevated > 130/80 Will continue same regimen along with efforts to limit dietary sodium.

## 2023-07-11 NOTE — Progress Notes (Signed)
 Date:  07/11/2023   Name:  Myrtha Tonkovich   DOB:  1945/04/21   MRN:  [id number]   Chief Complaint: Annual Exam Darianne Muralles is a 78 y.o. female who presents today for her Complete Annual Exam. She feels well. She reports exercising none. She reports she is sleeping well. Breast complaints none.  Health Maintenance  Topic Date Due   Zoster (Shingles) Vaccine (1 of 2) Never done   Medicare Annual Wellness Visit  07/07/2023   COVID-19 Vaccine (4 - 2024-25 season) 07/27/2023*   DEXA scan (bone density measurement)  07/10/2024*   Flu Shot  11/04/2023   DTaP/Tdap/Td vaccine (2 - Td or Tdap) 03/19/2029   Pneumonia Vaccine  Completed   Hepatitis C Screening  Completed   HPV Vaccine  Aged Out   Colon Cancer Screening  Discontinued  *Topic was postponed. The date shown is not the original due date.    Hypertension This is a chronic problem. The problem is controlled. Pertinent negatives include no chest pain, headaches, palpitations or shortness of breath. Past treatments include ACE inhibitors and beta blockers. There is no history of kidney disease, CAD/MI or CVA.  Hyperlipidemia This is a chronic problem. The problem is controlled. Pertinent negatives include no chest pain, myalgias or shortness of breath. Current antihyperlipidemic treatment includes statins.    Review of Systems  Constitutional:  Negative for fatigue and unexpected weight change.  HENT:  Negative for trouble swallowing.   Eyes:  Negative for visual disturbance.  Respiratory:  Negative for cough, chest tightness, shortness of breath and wheezing.   Cardiovascular:  Negative for chest pain, palpitations and leg swelling.  Gastrointestinal:  Negative for abdominal pain, constipation and diarrhea.  Musculoskeletal:  Negative for arthralgias and myalgias.  Neurological:  Negative for dizziness, weakness, light-headedness and headaches.     Lab Results  Component Value Date   NA 137 07/07/2022   K 5.0 07/07/2022   CO2 22  07/07/2022   GLUCOSE 104 (H) 07/07/2022   BUN 19 07/07/2022   CREATININE 0.71 07/07/2022   CALCIUM 10.2 07/07/2022   EGFR 88 07/07/2022   GFRNONAA 87 01/02/2020   Lab Results  Component Value Date   CHOL 222 (H) 07/07/2022   HDL 86 07/07/2022   LDLCALC 122 (H) 07/07/2022   TRIG 82 07/07/2022   CHOLHDL 2.6 07/07/2022   Lab Results  Component Value Date   TSH 3.450 07/07/2022   No results found for: "HGBA1C" Lab Results  Component Value Date   WBC 7.1 07/07/2022   HGB 14.3 07/07/2022   HCT 43.8 07/07/2022   MCV 89 07/07/2022   PLT 316 07/07/2022   Lab Results  Component Value Date   ALT 26 07/07/2022   AST 23 07/07/2022   ALKPHOS 64 07/07/2022   BILITOT 0.4 07/07/2022   No results found for: "25OHVITD2", "25OHVITD3", "VD25OH"   Patient Active Problem List   Diagnosis Date Noted   Elevated TSH 01/06/2022   Squamous cell carcinoma of leg, left 01/06/2022   Ocular migraine 07/01/2020   Meningioma (HCC) 05/20/2020   Essential hypertension 01/02/2020   Mixed hyperlipidemia 01/02/2020   Hx of melanoma of skin 01/02/2020   History of myxoma 07/20/2005    Allergies  Allergen Reactions   Penicillins Hives   Ezetimibe     Past Surgical History:  Procedure Laterality Date   ABDOMINAL HYSTERECTOMY     ANTERIOR CRUCIATE LIGAMENT REPAIR     Right KNEE   BREAST BIOPSY Left  stereo bx/clip-neg   BREAST CYST ASPIRATION Left    fna- neg   CATARACT EXTRACTION W/PHACO Left 09/28/2021   Procedure: CATARACT EXTRACTION PHACO AND INTRAOCULAR LENS PLACEMENT (IOC)  LEFT;  Surgeon: Nevada Crane, MD;  Location: [facility];  Service: Ophthalmology;  Laterality: Left;  3.26 00:30.5   CATARACT EXTRACTION W/PHACO Right 10/12/2021   Procedure: CATARACT EXTRACTION PHACO AND INTRAOCULAR LENS PLACEMENT (IOC) RIGHT Vivity Lens 3.88 00:34.1;  Surgeon: Nevada Crane, MD;  Location: [facility];  Service: Ophthalmology;  Laterality: Right;   COLONOSCOPY WITH  PROPOFOL N/A 08/01/2020   Procedure: COLONOSCOPY WITH PROPOFOL;  Surgeon: Midge Minium, MD;  Location: [facility];  Service: Endoscopy;  Laterality: N/A;   ECTOPIC PREGNANCY SURGERY     EXCISION OF ATRIAL MYXOMA  2005   KNEE CARTILAGE SURGERY     meniscus removed - left knee   MELANOMA EXCISION     Left Arm   OOPHORECTOMY     SQUAMOUS CELL CARCINOMA EXCISION     x 2- Left Leg, and Right Hand.    Social History   Tobacco Use   Smoking status: Former    Current packs/day: 0.00    Average packs/day: 0.1 packs/day for 20.0 years (1.6 ttl pk-yrs)    Types: Cigarettes    Start date: 25    Quit date: 2007    Years since quitting: 18.2   Smokeless tobacco: Never  Vaping Use   Vaping status: Never Used  Substance Use Topics   Alcohol use: Yes    Alcohol/week: 14.0 standard drinks of alcohol    Types: 14 Glasses of wine per week   Drug use: Never     Medication list has been reviewed and updated.  Current Meds  Medication Sig   aspirin EC 81 MG tablet Take 81 mg by mouth daily. Swallow whole.   calcium-vitamin D (OSCAL WITH D) 500-200 MG-UNIT tablet Take 1 tablet by mouth.   [DISCONTINUED] lisinopril (ZESTRIL) 40 MG tablet Take 1 tablet (40 mg total) by mouth daily.   [DISCONTINUED] metoprolol succinate (TOPROL-XL) 50 MG 24 hr tablet TAKE 1 TABLET BY MOUTH EVERY DAY   [DISCONTINUED] rosuvastatin (CRESTOR) 40 MG tablet Take 1 tablet (40 mg total) by mouth daily.       07/11/2023    8:36 AM 01/24/2023    2:27 PM 01/06/2022    9:16 AM 07/03/2021    8:36 AM  GAD 7 : Generalized Anxiety Score  Nervous, Anxious, on Edge 0 0 0 0  Control/stop worrying 0 0 0 0  Worry too much - different things 0 0 0 0  Trouble relaxing 0 0 0 0  Restless 0 0 0 0  Easily annoyed or irritable  0 0 0  Afraid - awful might happen 0 0 0 0  Total GAD 7 Score  0 0 0  Anxiety Difficulty Not difficult at all Not difficult at all Not difficult at all Not difficult at all       07/11/2023     8:36 AM 01/24/2023    2:27 PM 07/07/2022    8:35 AM  Depression screen PHQ 2/9  Decreased Interest 0 0 0  Down, Depressed, Hopeless 0 0 0  PHQ - 2 Score 0 0 0  Altered sleeping 0 0 0  Tired, decreased energy 0 0 0  Change in appetite 0 0 0  Feeling bad or failure about yourself  0 0 0  Trouble concentrating 0 0 0  Moving slowly or fidgety/restless 0 0 0  Suicidal thoughts 0 0 0  PHQ-9 Score 0 0 0  Difficult doing work/chores Not difficult at all Not difficult at all Not difficult at all    BP Readings from Last 3 Encounters:  07/11/23 (!) 144/86  01/24/23 122/79  07/07/22 132/84    Physical Exam Vitals and nursing note reviewed.  Constitutional:      General: She is not in acute distress.    Appearance: She is well-developed.  HENT:     Head: Normocephalic and atraumatic.     Right Ear: Tympanic membrane and ear canal normal.     Left Ear: Tympanic membrane and ear canal normal.     Nose:     Right Sinus: No maxillary sinus tenderness.     Left Sinus: No maxillary sinus tenderness.  Eyes:     General: No scleral icterus.       Right eye: No discharge.        Left eye: No discharge.     Conjunctiva/sclera: Conjunctivae normal.  Neck:     Thyroid: No thyromegaly.     Vascular: No carotid bruit.  Cardiovascular:     Rate and Rhythm: Normal rate and regular rhythm.     Pulses: Normal pulses.     Heart sounds: Normal heart sounds.  Pulmonary:     Effort: Pulmonary effort is normal. No respiratory distress.     Breath sounds: No wheezing.  Abdominal:     General: Bowel sounds are normal.     Palpations: Abdomen is soft.     Tenderness: There is no abdominal tenderness.  Musculoskeletal:     Cervical back: Normal range of motion. No erythema.     Right lower leg: No edema.     Left lower leg: No edema.  Lymphadenopathy:     Cervical: No cervical adenopathy.  Skin:    General: Skin is warm and dry.     Findings: No rash.  Neurological:     Mental Status: She is  alert and oriented to person, place, and time.     Cranial Nerves: No cranial nerve deficit.     Sensory: No sensory deficit.     Deep Tendon Reflexes: Reflexes are normal and symmetric.  Psychiatric:        Attention and Perception: Attention normal.        Mood and Affect: Mood normal.     Wt Readings from Last 3 Encounters:  07/11/23 155 lb 6 oz (70.5 kg)  01/24/23 154 lb (69.9 kg)  07/07/22 156 lb (70.8 kg)    BP (!) 144/86   Pulse 80   Ht 5\' 2"  (1.575 m)   Wt 155 lb 6 oz (70.5 kg)   SpO2 95%   BMI 28.42 kg/m   Assessment and Plan:  Problem List Items Addressed This Visit       Unprioritized   Essential hypertension (Chronic)   Blood pressure is elevated today. Current medications lisinopril and metoprolol. She will monitor at home - call if persistently elevated > 130/80 Will continue same regimen along with efforts to limit dietary sodium.       Relevant Medications   lisinopril (ZESTRIL) 40 MG tablet   metoprolol succinate (TOPROL-XL) 50 MG 24 hr tablet   rosuvastatin (CRESTOR) 40 MG tablet   Other Relevant Orders   CBC with Differential/Platelet   Comprehensive metabolic panel with GFR   Urinalysis, Routine w reflex microscopic   Mixed hyperlipidemia (Chronic)  LDL is  Lab Results  Component Value Date   LDLCALC 122 (H) 07/07/2022   Current regimen is Crestor 40 mg.  No medication side effects noted. Goal LDL is <70.       Relevant Medications   lisinopril (ZESTRIL) 40 MG tablet   metoprolol succinate (TOPROL-XL) 50 MG 24 hr tablet   rosuvastatin (CRESTOR) 40 MG tablet   Other Relevant Orders   Lipid panel   Elevated TSH   T4 has been normal Will repeat labs       Relevant Orders   TSH + free T4   Other Visit Diagnoses       Annual physical exam    -  Primary     Encounter for screening mammogram for breast cancer       Relevant Orders   MM 3D SCREENING MAMMOGRAM BILATERAL BREAST     Encounter for screening for osteoporosis        she declines screening     Elevated serum glucose       Relevant Orders   Hemoglobin A1c       Return in about 6 months (around 01/10/2024) for HTN.    Reubin Milan, MD [facility] [place]

## 2023-07-11 NOTE — Assessment & Plan Note (Signed)
 LDL is  Lab Results  Component Value Date   LDLCALC 122 (H) 07/07/2022   Current regimen is Crestor 40 mg.  No medication side effects noted. Goal LDL is <70.

## 2023-07-12 ENCOUNTER — Encounter: Payer: Self-pay | Admitting: Internal Medicine

## 2023-07-12 LAB — COMPREHENSIVE METABOLIC PANEL WITH GFR
ALT: 26 IU/L (ref 0–32)
AST: 22 IU/L (ref 0–40)
Albumin: 4.4 g/dL (ref 3.8–4.8)
Alkaline Phosphatase: 57 IU/L (ref 44–121)
BUN/Creatinine Ratio: 21 (ref 12–28)
BUN: 17 mg/dL (ref 8–27)
Bilirubin Total: 0.4 mg/dL (ref 0.0–1.2)
CO2: 23 mmol/L (ref 20–29)
Calcium: 10.2 mg/dL (ref 8.7–10.3)
Chloride: 99 mmol/L (ref 96–106)
Creatinine, Ser: 0.82 mg/dL (ref 0.57–1.00)
Globulin, Total: 2.3 g/dL (ref 1.5–4.5)
Glucose: 87 mg/dL (ref 70–99)
Potassium: 4.3 mmol/L (ref 3.5–5.2)
Sodium: 138 mmol/L (ref 134–144)
Total Protein: 6.7 g/dL (ref 6.0–8.5)
eGFR: 73 mL/min/{1.73_m2} (ref >59)

## 2023-07-12 LAB — URINALYSIS, ROUTINE W REFLEX MICROSCOPIC
Bilirubin, UA: NEGATIVE
Glucose, UA: NEGATIVE
Leukocytes,UA: NEGATIVE
Nitrite, UA: NEGATIVE
RBC, UA: NEGATIVE
Specific Gravity, UA: 1.017 (ref 1.005–1.030)
Urobilinogen, Ur: 0.2 mg/dL (ref 0.2–1.0)
pH, UA: 6 (ref 5.0–7.5)

## 2023-07-12 LAB — CBC WITH DIFFERENTIAL/PLATELET
Basophils Absolute: 0 10*3/uL (ref 0.0–0.2)
Basos: 1 %
EOS (ABSOLUTE): 0.1 10*3/uL (ref 0.0–0.4)
Eos: 2 %
Hematocrit: 40.5 % (ref 34.0–46.6)
Hemoglobin: 13.6 g/dL (ref 11.1–15.9)
Immature Grans (Abs): 0 10*3/uL (ref 0.0–0.1)
Immature Granulocytes: 0 %
Lymphocytes Absolute: 1.9 10*3/uL (ref 0.7–3.1)
Lymphs: 29 %
MCH: 30.2 pg (ref 26.6–33.0)
MCHC: 33.6 g/dL (ref 31.5–35.7)
MCV: 90 fL (ref 79–97)
Monocytes Absolute: 0.6 10*3/uL (ref 0.1–0.9)
Monocytes: 9 %
Neutrophils Absolute: 3.9 10*3/uL (ref 1.4–7.0)
Neutrophils: 59 %
Platelets: 273 10*3/uL (ref 150–450)
RBC: 4.51 x10E6/uL (ref 3.77–5.28)
RDW: 11.8 % (ref 11.7–15.4)
WBC: 6.6 10*3/uL (ref 3.4–10.8)

## 2023-07-12 LAB — LIPID PANEL
Chol/HDL Ratio: 3.2 ratio (ref 0.0–4.4)
Cholesterol, Total: 219 mg/dL — ABNORMAL HIGH (ref 100–199)
HDL: 68 mg/dL (ref >39)
LDL Chol Calc (NIH): 123 mg/dL — ABNORMAL HIGH (ref 0–99)
Triglycerides: 158 mg/dL — ABNORMAL HIGH (ref 0–149)
VLDL Cholesterol Cal: 28 mg/dL (ref 5–40)

## 2023-07-12 LAB — TSH+FREE T4
Free T4: 1.09 ng/dL (ref 0.82–1.77)
TSH: 2.52 u[IU]/mL (ref 0.450–4.500)

## 2023-07-12 LAB — HEMOGLOBIN A1C
Est. average glucose Bld gHb Est-mCnc: 114 mg/dL
Hgb A1c MFr Bld: 5.6 % (ref 4.8–5.6)

## 2023-09-28 ENCOUNTER — Ambulatory Visit

## 2023-10-18 ENCOUNTER — Telehealth: Payer: Self-pay | Admitting: Internal Medicine

## 2023-10-18 NOTE — Telephone Encounter (Signed)
 Copied from CRM [id number]. Topic: Medicare AWV >> Oct 18, 2023  9:23 AM Nathanel DEL wrote: Reason for CRM: Called LVM 10/18/2023 to schedule AWV. Please schedule Virtual or Telehealth visits ONLY.   Nathanel Paschal; Care Guide Ambulatory Clinical Support [place] [facility] Direct Dial: [phone]

## 2023-10-20 IMAGING — MG MM DIGITAL SCREENING BILAT W/ TOMO AND CAD
6 of 10 series · 6 of 30 positions shown · non-contrast
Comparison: Previous exam(s).

CLINICAL DATA: Screening.

EXAM:
DIGITAL SCREENING BILATERAL MAMMOGRAM WITH TOMOSYNTHESIS AND CAD
TECHNIQUE: Bilateral screening digital craniocaudal and mediolateral oblique
mammograms were obtained. Bilateral screening digital breast
tomosynthesis was performed. The images were evaluated with
computer-aided detection.

[L CC synth-2D (1 of 2)]
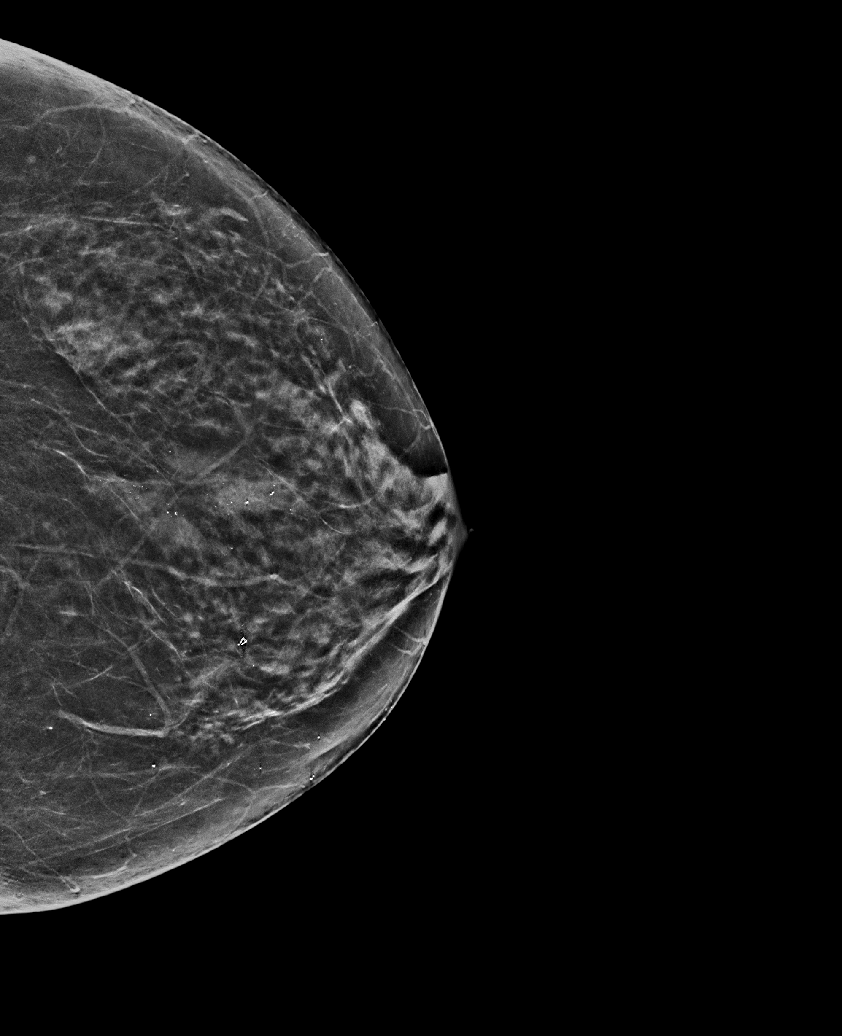

[L CC synth-2D (2 of 2)]
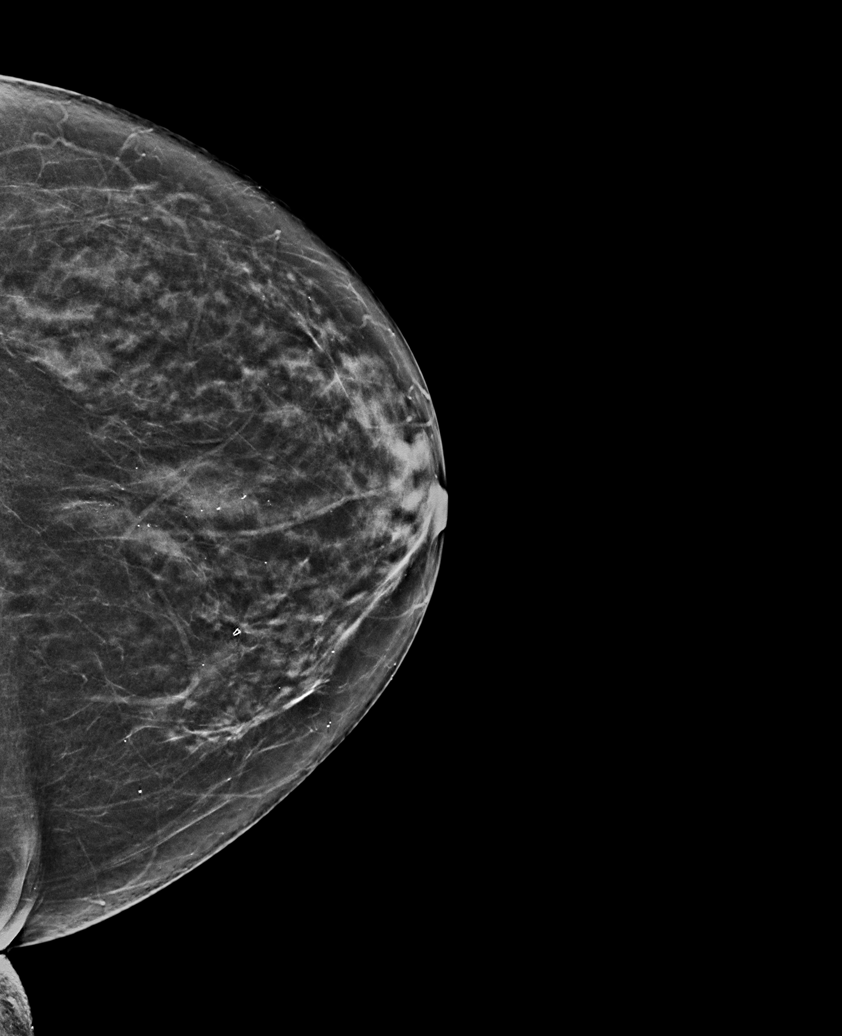

[L MLO synth-2D]
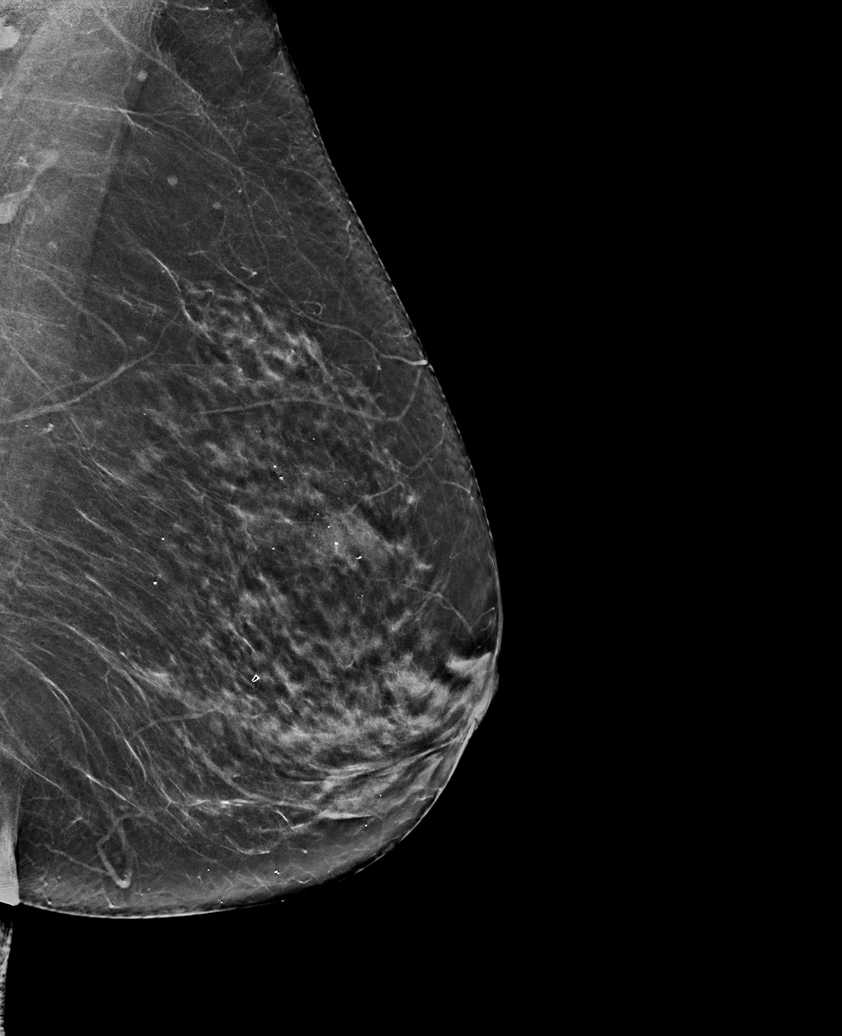

[R MLO synth-2D]
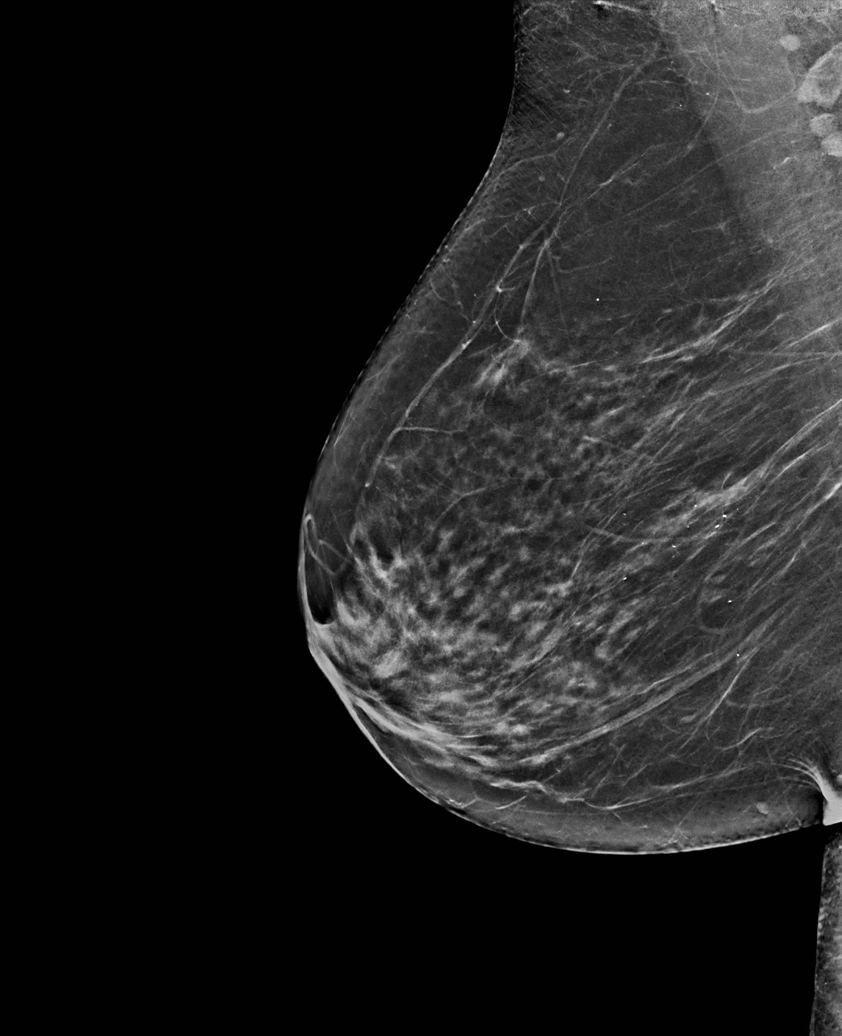

[R CC synth-2D]
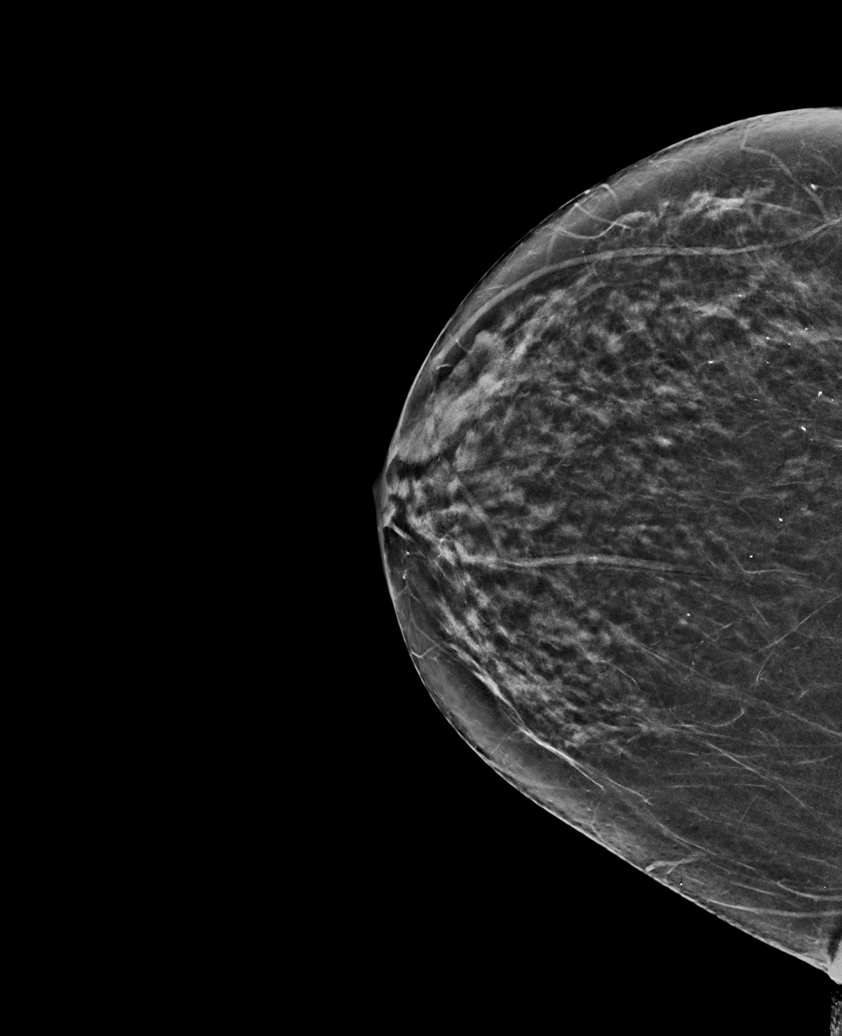

[L MLO tomo · tomo slice 33/66.0]
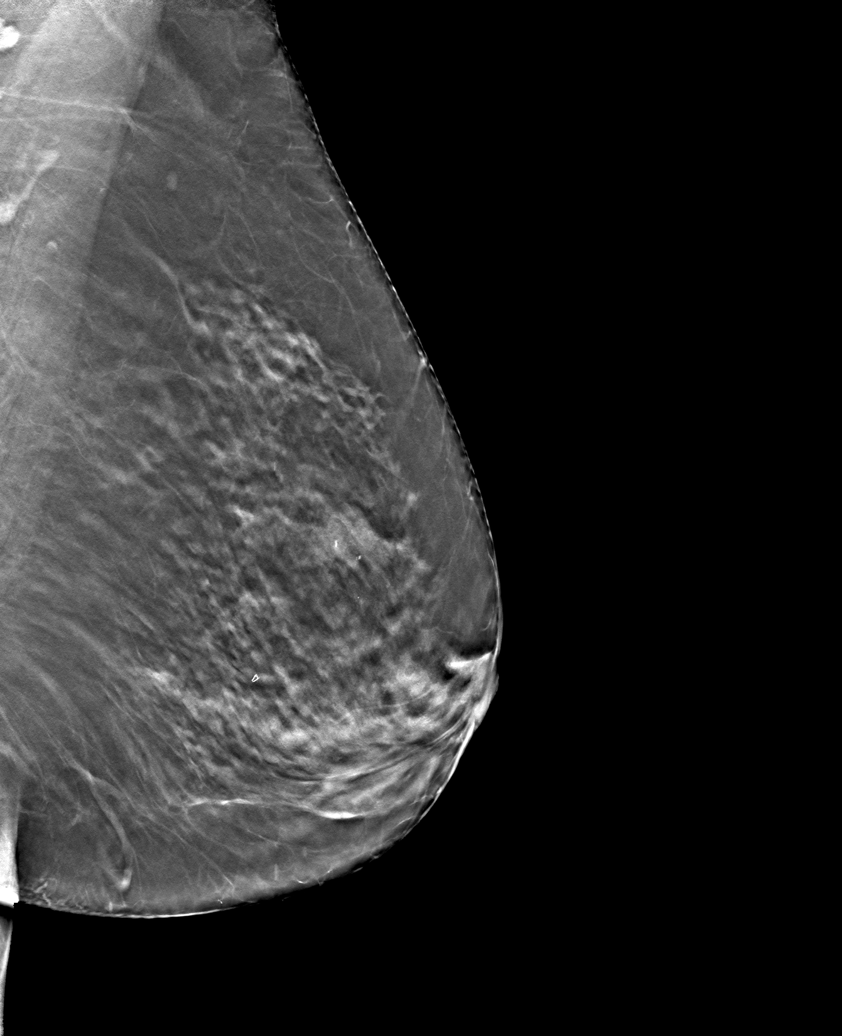

[6 of 30 positions shown; findings below may reference images not displayed]

ACR Breast Density Category c: The breast tissue is heterogeneously
dense, which may obscure small masses.
FINDINGS: There are no findings suspicious for malignancy.
IMPRESSION: No mammographic evidence of malignancy. A result letter of this
screening mammogram will be mailed directly to the patient.

RECOMMENDATION:
Screening mammogram in one year. (Code:Q3-W-BC3)

BI-RADS CATEGORY  1: Negative.

## 2023-11-28 DIAGNOSIS — L821 Other seborrheic keratosis: Secondary | ICD-10-CM | POA: Diagnosis not present

## 2023-11-28 DIAGNOSIS — L570 Actinic keratosis: Secondary | ICD-10-CM | POA: Diagnosis not present

## 2024-01-10 ENCOUNTER — Ambulatory Visit

## 2024-01-10 ENCOUNTER — Encounter: Payer: Self-pay | Admitting: Internal Medicine

## 2024-01-10 ENCOUNTER — Ambulatory Visit (INDEPENDENT_AMBULATORY_CARE_PROVIDER_SITE_OTHER): Admitting: Internal Medicine

## 2024-01-10 VITALS — BP 112/76 | HR 86 | Ht 62.0 in | Wt 158.0 lb

## 2024-01-10 DIAGNOSIS — I10 Essential (primary) hypertension: Secondary | ICD-10-CM

## 2024-01-10 DIAGNOSIS — C84A0 Cutaneous T-cell lymphoma, unspecified, unspecified site: Secondary | ICD-10-CM | POA: Diagnosis not present

## 2024-01-10 DIAGNOSIS — Z23 Encounter for immunization: Secondary | ICD-10-CM | POA: Diagnosis not present

## 2024-01-10 DIAGNOSIS — Z0000 Encounter for general adult medical examination without abnormal findings: Secondary | ICD-10-CM

## 2024-01-10 DIAGNOSIS — E782 Mixed hyperlipidemia: Secondary | ICD-10-CM

## 2024-01-10 NOTE — Assessment & Plan Note (Signed)
Continue regular Dermatology follow up. 

## 2024-01-10 NOTE — Progress Notes (Addendum)
 Subjective:   Samantha Hodge is a 78 y.o. female who presents for Medicare Annual (Subsequent) preventive examination.  Visit Complete: In person  Patient Medicare AWV questionnaire was completed by the patient on 01/10/2024 ; I have confirmed that all information answered by patient is correct and no changes since this date.  Cardiac Risk Factors include: advanced age (>30men, >84 women);hypertension     Objective:    Today's Vitals   01/10/24 0820  BP: 112/76  Pulse: 86  SpO2: 97%  Weight: 158 lb (71.7 kg)  Height: 5' 2 (1.575 m)  PainSc: 0-No pain   Body mass index is 28.9 kg/m.     01/10/2024    8:22 AM 07/07/2022    8:35 AM 09/28/2021    9:43 AM 08/01/2020   11:01 AM 02/02/2020    9:27 AM  Advanced Directives  Does Patient Have a Medical Advance Directive? Yes No;Yes Yes Yes No  Type of Advance Directive Living will Living will Healthcare Power of [place];Living will Healthcare Power of [place];Living will   Does patient want to make changes to medical advance directive? No - Patient declined  No - Patient declined No - Patient declined   Copy of Healthcare Power of Attorney in Chart?   No - copy requested No - copy requested     Current Medications (verified) Outpatient Encounter Medications as of 01/10/2024  Medication Sig   aspirin EC 81 MG tablet Take 81 mg by mouth daily. Swallow whole.   calcium -vitamin D (OSCAL WITH D) 500-200 MG-UNIT tablet Take 1 tablet by mouth.   lisinopril  (ZESTRIL ) 40 MG tablet Take 1 tablet (40 mg total) by mouth daily.   metoprolol  succinate (TOPROL -XL) 50 MG 24 hr tablet TAKE 1 TABLET BY MOUTH EVERY DAY   rosuvastatin  (CRESTOR ) 40 MG tablet Take 1 tablet (40 mg total) by mouth daily.   No facility-administered encounter medications on file as of 01/10/2024.    Allergies (verified) Penicillins and Ezetimibe   History: Past Medical History:  Diagnosis Date   Cancer (HCC)    melanoma   Hyperlipidemia    Hypertension    Past  Surgical History:  Procedure Laterality Date   ABDOMINAL HYSTERECTOMY     ANTERIOR CRUCIATE LIGAMENT REPAIR     Right KNEE   BREAST BIOPSY Left    stereo bx/clip-neg   BREAST CYST ASPIRATION Left    fna- neg   CATARACT EXTRACTION W/PHACO Left 09/28/2021   Procedure: CATARACT EXTRACTION PHACO AND INTRAOCULAR LENS PLACEMENT (IOC)  LEFT;  Surgeon: Myrna Adine Anes, MD;  Location: [facility];  Service: Ophthalmology;  Laterality: Left;  3.26 00:30.5   CATARACT EXTRACTION W/PHACO Right 10/12/2021   Procedure: CATARACT EXTRACTION PHACO AND INTRAOCULAR LENS PLACEMENT (IOC) RIGHT Vivity Lens 3.88 00:34.1;  Surgeon: Myrna Adine Anes, MD;  Location: [facility];  Service: Ophthalmology;  Laterality: Right;   COLONOSCOPY WITH PROPOFOL  N/A 08/01/2020   Procedure: COLONOSCOPY WITH PROPOFOL ;  Surgeon: Jinny Carmine, MD;  Location: [facility];  Service: Endoscopy;  Laterality: N/A;   ECTOPIC PREGNANCY SURGERY     EXCISION OF ATRIAL MYXOMA  2005   KNEE CARTILAGE SURGERY     meniscus removed - left knee   MELANOMA EXCISION     Left Arm   OOPHORECTOMY     SQUAMOUS CELL CARCINOMA EXCISION     x 2- Left Leg, and Right Hand.   Family History  Problem Relation Age of Onset   Breast cancer Mother  Anuerysm Daughter    Heart disease Maternal Grandfather    Social History   Socioeconomic History   Marital status: Married    Spouse name: Not on file   Number of children: Not on file   Years of education: Not on file   Highest education level: Bachelor's degree (e.g., BA, AB, BS)  Occupational History   Not on file  Tobacco Use   Smoking status: Former    Current packs/day: 0.00    Average packs/day: 0.1 packs/day for 20.0 years (1.6 ttl pk-yrs)    Types: Cigarettes    Start date: 57    Quit date: 2007    Years since quitting: 18.7   Smokeless tobacco: Never  Vaping Use   Vaping status: Never Used  Substance and Sexual Activity   Alcohol use: Yes     Alcohol/week: 14.0 standard drinks of alcohol    Types: 14 Glasses of wine per week   Drug use: Never   Sexual activity: Yes  Other Topics Concern   Not on file  Social History Narrative   Not on file   Social Drivers of Health   Financial Resource Strain: Low Risk  (01/06/2024)   Overall Financial Resource Strain (CARDIA)    Difficulty of Paying Living Expenses: Not hard at all  Food Insecurity: No Food Insecurity (01/06/2024)   Hunger Vital Sign    Worried About Running Out of Food in the Last Year: Never true    Ran Out of Food in the Last Year: Never true  Transportation Needs: No Transportation Needs (01/06/2024)   PRAPARE - Administrator, Civil Service (Medical): No    Lack of Transportation (Non-Medical): No  Physical Activity: Insufficiently Active (01/06/2024)   Exercise Vital Sign    Days of Exercise per Week: 2 days    Minutes of Exercise per Session: 20 min  Stress: No Stress Concern Present (01/06/2024)   [employer] of Occupational Health - Occupational Stress Questionnaire    Feeling of Stress: Not at all  Social Connections: Patient Declined (01/10/2024)   Social Connection and Isolation Panel    Frequency of Communication with Friends and Family: Patient declined    Frequency of Social Gatherings with Friends and Family: Patient declined    Attends Religious Services: Patient declined    Database administrator or Organizations: Patient declined    Attends Engineer, structural: Patient declined    Marital Status: Patient declined    Tobacco Counseling Counseling given: Not Answered   Clinical Intake:  Pre-visit preparation completed: Yes  Pain : No/denies pain Pain Score: 0-No pain     BMI - recorded: 28.9 Nutritional Status: BMI 25 -29 Overweight Nutritional Risks: Other (Comment) Diabetes: No  How often do you need to have someone help you when you read instructions, pamphlets, or other written materials from your doctor  or pharmacy?: 1 - Never  Interpreter Needed?: No  Information entered by :: Makaylie Dedeaux, CMA   Activities of Daily Living    01/10/2024    8:22 AM  In your present state of health, do you have any difficulty performing the following activities:  Hearing? 0  Vision? 0  Difficulty concentrating or making decisions? 0  Walking or climbing stairs? 0  Dressing or bathing? 0  Doing errands, shopping? 0  Preparing Food and eating ? N  Using the Toilet? N  In the past six months, have you accidently leaked urine? N  Do you have problems  with loss of bowel control? N  Managing your Medications? N  Managing your Finances? N  Housekeeping or managing your Housekeeping? N    Patient Care Team: Justus Leita DEL, MD as PCP - General (Internal Medicine) Cathlyn Seal, MD as Referring Physician (Dermatology)  Indicate any recent Medical Services you may have received from other than [facility] providers in the past year (date may be approximate).     Assessment:   This is a routine wellness examination for Samantha Hodge.  Hearing/Vision screen Hearing Screening - Comments:: No concerns Vision Screening - Comments:: No concerns.   Goals Addressed               This Visit's Progress     Patient Stated     Patient Stated (pt-stated)        Patient would like to live long enough to see, and spend time with her grandchildren.       Depression Screen    01/10/2024    8:07 AM 07/11/2023    8:36 AM 01/24/2023    2:27 PM 07/07/2022    8:35 AM 01/06/2022    9:15 AM 07/03/2021    8:36 AM 12/18/2020    8:25 AM  PHQ 2/9 Scores  PHQ - 2 Score 0 0 0 0 0 0 0  PHQ- 9 Score 0 0 0 0 0 0 0    Fall Risk    01/10/2024    8:07 AM 07/11/2023    8:36 AM 01/24/2023    2:27 PM 07/07/2022    8:35 AM 01/06/2022    9:15 AM  Fall Risk   Falls in the past year? 0 0 0 0 0  Number falls in past yr: 0 0 0 0 0  Injury with Fall? 0 0 0 0 0  Risk for fall due to : No Fall Risks No Fall Risks No Fall Risks  No Fall Risks No Fall Risks  Follow up Falls evaluation completed Falls evaluation completed Falls evaluation completed Falls evaluation completed Falls evaluation completed      Data saved with a previous flowsheet row definition    MEDICARE RISK AT HOME: Medicare Risk at Home Any stairs in or around the home?: No If so, are there any without handrails?: No Home free of loose throw rugs in walkways, pet beds, electrical cords, etc?: Yes Adequate lighting in your home to reduce risk of falls?: Yes Life alert?: No Use of a cane, walker or w/c?: No Grab bars in the bathroom?: Yes Shower chair or bench in shower?: No Elevated toilet seat or a handicapped toilet?: Yes  TIMED UP AND GO:  Was the test performed?  Yes  Length of time to ambulate 10 feet: 5 sec Gait steady and fast without use of assistive device    Cognitive Function:        01/10/2024    8:23 AM 07/07/2022    8:36 AM  6CIT Screen  What Year? 4 points 0 points  What month? 0 points 0 points  What time? 0 points 0 points  Count back from 20 0 points 0 points  Months in reverse 2 points 0 points  Repeat phrase 4 points 4 points  Total Score 10 points 4 points    Immunizations Immunization History  Administered Date(s) Administered   Fluad Quad(high Dose 65+) 01/28/2020, 12/18/2020, 01/06/2022   Fluad Trivalent(High Dose 65+) 01/24/2023   PFIZER(Purple Top)SARS-COV-2 Vaccination 05/03/2019, 05/24/2019, 03/24/2020   Pneumococcal Conjugate-13 04/05/2017   Pneumococcal Polysaccharide-23 04/05/2018  Tdap 03/20/2019   Tetanus 03/14/2008    TDAP status: Up to date  Flu Vaccine status: Completed at today's visit  Pneumococcal vaccine status: Up to date  Covid-19 vaccine status: Completed vaccines  Qualifies for Shingles Vaccine? Yes   Zostavax completed No   Shingrix Completed?: No.    Education has been provided regarding the importance of this vaccine. Patient has been advised to call insurance company  to determine out of pocket expense if they have not yet received this vaccine. Advised may also receive vaccine at local pharmacy or Health Dept. Verbalized acceptance and understanding.  Screening Tests Health Maintenance  Topic Date Due   Zoster Vaccines- Shingrix (1 of 2) Never done   Influenza Vaccine  11/04/2023   COVID-19 Vaccine (4 - 2025-26 season) 12/05/2023   Mammogram  02/17/2024   DEXA SCAN  07/10/2024 (Originally 12/25/2009)   Medicare Annual Wellness (AWV)  01/09/2025   DTaP/Tdap/Td (2 - Td or Tdap) 03/19/2029   Pneumococcal Vaccine: 50+ Years  Completed   Hepatitis C Screening  Completed   Meningococcal B Vaccine  Aged Out   Colonoscopy  Discontinued    Health Maintenance  Health Maintenance Due  Topic Date Due   Zoster Vaccines- Shingrix (1 of 2) Never done   Influenza Vaccine  11/04/2023   COVID-19 Vaccine (4 - 2025-26 season) 12/05/2023   Mammogram  02/17/2024    Colorectal cancer screening: No longer required.   Mammogram status: Completed 02/17/2023. Repeat every year  Lung Cancer Screening: (Low Dose CT Chest recommended if Age 18-80 years, 20 pack-year currently smoking OR have quit w/in 15years.) does not qualify.   Additional Screening:  Hepatitis C Screening: does qualify; Completed 07/01/2020  Vision Screening: Recommended annual ophthalmology exams for early detection of glaucoma and other disorders of the eye. Is the patient up to date with their annual eye exam?  No  Who is the provider or what is the name of the office in which the patient attends annual eye exams? N/A If pt is not established with a provider, would they like to be referred to a provider to establish care? No .   Dental Screening: Recommended annual dental exams for proper oral hygiene  Community Resource Referral / Chronic Care Management: CRR required this visit?  No   CCM required this visit?  No     Plan:     I have personally reviewed and noted the following in  the patient's chart:   Medical and social history Use of alcohol, tobacco or illicit drugs  Current medications and supplements including opioid prescriptions. Patient is not currently taking opioid prescriptions. Functional ability and status Nutritional status Physical activity Advanced directives List of other physicians Hospitalizations, surgeries, and ER visits in previous 12 months Vitals Screenings to include cognitive, depression, and falls Referrals and appointments  In addition, I have reviewed and discussed with patient certain preventive protocols, quality metrics, and best practice recommendations. A written personalized care plan for preventive services as well as general preventive health recommendations were provided to patient.     Samantha Hodge, CMA   01/10/2024   After Visit Summary: (In Person-Printed) AVS printed and given to the patient  Nurse Notes: None.

## 2024-01-10 NOTE — Patient Instructions (Signed)
  Samantha Hodge , Thank you for taking time to come for your Medicare Wellness Visit. I appreciate your ongoing commitment to your health goals. Please review the following plan we discussed and let me know if I can assist you in the future.   These are the goals we discussed:  Goals       Patient Stated     Patient Stated (pt-stated)      Patient would like to live long enough to see, and spend time with her grandchildren.        This is a list of the screening recommended for you and due dates:  Health Maintenance  Topic Date Due   Zoster (Shingles) Vaccine (1 of 2) Never done   Flu Shot  11/04/2023   COVID-19 Vaccine (4 - 2025-26 season) 12/05/2023   Breast Cancer Screening  02/17/2024   DEXA scan (bone density measurement)  07/10/2024*   Medicare Annual Wellness Visit  01/09/2025   DTaP/Tdap/Td vaccine (2 - Td or Tdap) 03/19/2029   Pneumococcal Vaccine for age over 69  Completed   Hepatitis C Screening  Completed   Meningitis B Vaccine  Aged Out   Colon Cancer Screening  Discontinued  *Topic was postponed. The date shown is not the original due date.     [name]  Primary Care & Sports Medicine [facility] CMA [facility] [address]  [place] [address] Office [phone]  Fax: [phone]

## 2024-01-10 NOTE — Addendum Note (Signed)
 Addended by: JUSTUS LEITA DEL on: 01/10/2024 09:01 AM   Modules accepted: Level of Service

## 2024-01-10 NOTE — Assessment & Plan Note (Signed)
 LDL is  Lab Results  Component Value Date   LDLCALC 123 (H) 07/11/2023   Current medication regimen is Crestor  - reminded last visit to take it daily. Goal LDL is < 100.

## 2024-01-10 NOTE — Assessment & Plan Note (Signed)
 Well controlled blood pressure today. Current regimen is lisinopril  and metoprolol . No medication side effects noted.

## 2024-01-10 NOTE — Progress Notes (Signed)
 Date:  01/10/2024   Name:  Samantha Hodge   DOB:  1945-08-21   MRN:  [id number]   Chief Complaint: Hypertension  Hypertension This is a chronic problem. The problem is controlled. Pertinent negatives include no chest pain, headaches, palpitations or shortness of breath. Past treatments include ACE inhibitors and beta blockers. The current treatment provides significant improvement. There is no history of kidney disease, CAD/MI or CVA.  Hyperlipidemia This is a chronic problem. Recent lipid tests were reviewed and are variable. Pertinent negatives include no chest pain, myalgias or shortness of breath. Current antihyperlipidemic treatment includes statins. The current treatment provides moderate improvement of lipids.    Review of Systems  Constitutional:  Negative for fatigue and unexpected weight change.  HENT:  Negative for trouble swallowing.   Eyes:  Negative for visual disturbance.  Respiratory:  Negative for cough, chest tightness, shortness of breath and wheezing.   Cardiovascular:  Negative for chest pain, palpitations and leg swelling.  Gastrointestinal:  Negative for abdominal pain, constipation and diarrhea.  Musculoskeletal:  Negative for arthralgias and myalgias.  Neurological:  Negative for dizziness, weakness, light-headedness and headaches.     Lab Results  Component Value Date   NA 138 07/11/2023   K 4.3 07/11/2023   CO2 23 07/11/2023   GLUCOSE 87 07/11/2023   BUN 17 07/11/2023   CREATININE 0.82 07/11/2023   CALCIUM  10.2 07/11/2023   EGFR 73 07/11/2023   GFRNONAA 87 01/02/2020   Lab Results  Component Value Date   CHOL 219 (H) 07/11/2023   HDL 68 07/11/2023   LDLCALC 123 (H) 07/11/2023   TRIG 158 (H) 07/11/2023   CHOLHDL 3.2 07/11/2023   Lab Results  Component Value Date   TSH 2.520 07/11/2023   Lab Results  Component Value Date   HGBA1C 5.6 07/11/2023   Lab Results  Component Value Date   WBC 6.6 07/11/2023   HGB 13.6 07/11/2023   HCT 40.5  07/11/2023   MCV 90 07/11/2023   PLT 273 07/11/2023   Lab Results  Component Value Date   ALT 26 07/11/2023   AST 22 07/11/2023   ALKPHOS 57 07/11/2023   BILITOT 0.4 07/11/2023   No results found for: 25OHVITD2, 25OHVITD3, VD25OH   Patient Active Problem List   Diagnosis Date Noted   Cutaneous T-cell lymphoma, unspecified body region (HCC) 01/10/2024   Elevated TSH 01/06/2022   Squamous cell carcinoma of leg, left 01/06/2022   Ocular migraine 07/01/2020   Meningioma (HCC) 05/20/2020   Essential hypertension 01/02/2020   Mixed hyperlipidemia 01/02/2020   Hx of melanoma of skin 01/02/2020   History of myxoma 07/20/2005    Allergies  Allergen Reactions   Penicillins Hives   Ezetimibe     Past Surgical History:  Procedure Laterality Date   ABDOMINAL HYSTERECTOMY     ANTERIOR CRUCIATE LIGAMENT REPAIR     Right KNEE   BREAST BIOPSY Left    stereo bx/clip-neg   BREAST CYST ASPIRATION Left    fna- neg   CATARACT EXTRACTION W/PHACO Left 09/28/2021   Procedure: CATARACT EXTRACTION PHACO AND INTRAOCULAR LENS PLACEMENT (IOC)  LEFT;  Surgeon: Myrna Adine Anes, MD;  Location: [facility];  Service: Ophthalmology;  Laterality: Left;  3.26 00:30.5   CATARACT EXTRACTION W/PHACO Right 10/12/2021   Procedure: CATARACT EXTRACTION PHACO AND INTRAOCULAR LENS PLACEMENT (IOC) RIGHT Vivity Lens 3.88 00:34.1;  Surgeon: Myrna Adine Anes, MD;  Location: [facility];  Service: Ophthalmology;  Laterality: Right;   COLONOSCOPY  WITH PROPOFOL  N/A 08/01/2020   Procedure: COLONOSCOPY WITH PROPOFOL ;  Surgeon: Jinny Carmine, MD;  Location: [facility];  Service: Endoscopy;  Laterality: N/A;   ECTOPIC PREGNANCY SURGERY     EXCISION OF ATRIAL MYXOMA  2005   KNEE CARTILAGE SURGERY     meniscus removed - left knee   MELANOMA EXCISION     Left Arm   OOPHORECTOMY     SQUAMOUS CELL CARCINOMA EXCISION     x 2- Left Leg, and Right Hand.    Social History   Tobacco Use    Smoking status: Former    Current packs/day: 0.00    Average packs/day: 0.1 packs/day for 20.0 years (1.6 ttl pk-yrs)    Types: Cigarettes    Start date: 53    Quit date: 2007    Years since quitting: 18.7   Smokeless tobacco: Never  Vaping Use   Vaping status: Never Used  Substance Use Topics   Alcohol use: Yes    Alcohol/week: 14.0 standard drinks of alcohol    Types: 14 Glasses of wine per week   Drug use: Never     Medication list has been reviewed and updated.  Current Meds  Medication Sig   aspirin EC 81 MG tablet Take 81 mg by mouth daily. Swallow whole.   calcium -vitamin D (OSCAL WITH D) 500-200 MG-UNIT tablet Take 1 tablet by mouth.   lisinopril  (ZESTRIL ) 40 MG tablet Take 1 tablet (40 mg total) by mouth daily.   metoprolol  succinate (TOPROL -XL) 50 MG 24 hr tablet TAKE 1 TABLET BY MOUTH EVERY DAY   rosuvastatin  (CRESTOR ) 40 MG tablet Take 1 tablet (40 mg total) by mouth daily.       01/10/2024    8:07 AM 07/11/2023    8:36 AM 01/24/2023    2:27 PM 01/06/2022    9:16 AM  GAD 7 : Generalized Anxiety Score  Nervous, Anxious, on Edge 0 0 0 0  Control/stop worrying 0 0 0 0  Worry too much - different things 0 0 0 0  Trouble relaxing 0 0 0 0  Restless 0 0 0 0  Easily annoyed or irritable 0  0 0  Afraid - awful might happen 0 0 0 0  Total GAD 7 Score 0  0 0  Anxiety Difficulty Not difficult at all Not difficult at all Not difficult at all Not difficult at all       01/10/2024    8:07 AM 07/11/2023    8:36 AM 01/24/2023    2:27 PM  Depression screen PHQ 2/9  Decreased Interest 0 0 0  Down, Depressed, Hopeless 0 0 0  PHQ - 2 Score 0 0 0  Altered sleeping 0 0 0  Tired, decreased energy 0 0 0  Change in appetite 0 0 0  Feeling bad or failure about yourself  0 0 0  Trouble concentrating 0 0 0  Moving slowly or fidgety/restless 0 0 0  Suicidal thoughts 0 0 0  PHQ-9 Score 0 0 0  Difficult doing work/chores Not difficult at all Not difficult at all Not  difficult at all    BP Readings from Last 3 Encounters:  01/10/24 112/76  01/10/24 112/76  07/11/23 (!) 144/86    Physical Exam Vitals and nursing note reviewed.  Constitutional:      General: She is not in acute distress.    Appearance: She is well-developed.  HENT:     Head: Normocephalic and atraumatic.  Pulmonary:  Effort: Pulmonary effort is normal. No respiratory distress.  Skin:    General: Skin is warm and dry.     Findings: No rash.  Neurological:     Mental Status: She is alert and oriented to person, place, and time.  Psychiatric:        Mood and Affect: Mood normal.        Behavior: Behavior normal.     Wt Readings from Last 3 Encounters:  01/10/24 158 lb (71.7 kg)  01/10/24 158 lb (71.7 kg)  07/11/23 155 lb 6 oz (70.5 kg)    BP 112/76   Pulse 86   Ht 5' 2 (1.575 m)   Wt 158 lb (71.7 kg)   SpO2 97%   BMI 28.90 kg/m   Assessment and Plan:  Problem List Items Addressed This Visit       Unprioritized   Cutaneous T-cell lymphoma, unspecified body region [facility])   Continue regular Dermatology follow up      Essential hypertension - Primary (Chronic)   Well controlled blood pressure today. Current regimen is lisinopril  and metoprolol . No medication side effects noted.        Mixed hyperlipidemia (Chronic)   LDL is  Lab Results  Component Value Date   LDLCALC 123 (H) 07/11/2023   Current medication regimen is Crestor  - reminded last visit to take it daily. Goal LDL is < 100.       Other Visit Diagnoses       Encounter for immunization       Relevant Orders   Flu vaccine HIGH DOSE PF(Fluzone Trivalent) (Completed)      MAW done today by CMA.  Return in about 6 months (around 07/10/2024) for [facility] CPX DR Lemon.    Leita HILARIO Adie, MD [facility] Primary [facility] [place]

## 2024-01-19 DIAGNOSIS — Z85820 Personal history of malignant melanoma of skin: Secondary | ICD-10-CM | POA: Diagnosis not present

## 2024-01-19 DIAGNOSIS — Z859 Personal history of malignant neoplasm, unspecified: Secondary | ICD-10-CM | POA: Diagnosis not present

## 2024-01-19 DIAGNOSIS — L570 Actinic keratosis: Secondary | ICD-10-CM | POA: Diagnosis not present

## 2024-01-19 DIAGNOSIS — L578 Other skin changes due to chronic exposure to nonionizing radiation: Secondary | ICD-10-CM | POA: Diagnosis not present

## 2024-01-19 DIAGNOSIS — L821 Other seborrheic keratosis: Secondary | ICD-10-CM | POA: Diagnosis not present

## 2024-01-19 DIAGNOSIS — Z872 Personal history of diseases of the skin and subcutaneous tissue: Secondary | ICD-10-CM | POA: Diagnosis not present

## 2024-01-19 DIAGNOSIS — C84A0 Cutaneous T-cell lymphoma, unspecified, unspecified site: Secondary | ICD-10-CM | POA: Diagnosis not present

## 2024-02-20 ENCOUNTER — Ambulatory Visit
Admission: RE | Admit: 2024-02-20 | Discharge: 2024-02-20 | Disposition: A | Discharge status: Home / Self Care | Source: Ambulatory Visit | Attending: Internal Medicine | Admitting: Internal Medicine

## 2024-02-20 DIAGNOSIS — Z1231 Encounter for screening mammogram for malignant neoplasm of breast: Secondary | ICD-10-CM | POA: Insufficient documentation

## 2024-07-11 ENCOUNTER — Encounter: Admitting: Student

## 2025-01-23 ENCOUNTER — Ambulatory Visit
